# Patient Record
Sex: Male | Born: 1940 | Race: White | Hispanic: No | Marital: Married | Smoking: Former smoker
Health system: Southern US, Community
[De-identification: ages and names within clinical notes are randomized; demographics above are authoritative.]

## PROBLEM LIST (undated history)

## (undated) DIAGNOSIS — M25511 Pain in right shoulder: Secondary | ICD-10-CM

## (undated) DIAGNOSIS — F329 Major depressive disorder, single episode, unspecified: Secondary | ICD-10-CM

## (undated) DIAGNOSIS — S86319A Strain of muscle(s) and tendon(s) of peroneal muscle group at lower leg level, unspecified leg, initial encounter: Secondary | ICD-10-CM

## (undated) DIAGNOSIS — C4492 Squamous cell carcinoma of skin, unspecified: Secondary | ICD-10-CM

## (undated) DIAGNOSIS — N529 Male erectile dysfunction, unspecified: Secondary | ICD-10-CM

## (undated) DIAGNOSIS — M159 Polyosteoarthritis, unspecified: Secondary | ICD-10-CM

## (undated) DIAGNOSIS — M1990 Unspecified osteoarthritis, unspecified site: Secondary | ICD-10-CM

## (undated) DIAGNOSIS — M7660 Achilles tendinitis, unspecified leg: Secondary | ICD-10-CM

## (undated) DIAGNOSIS — R200 Anesthesia of skin: Secondary | ICD-10-CM

## (undated) DIAGNOSIS — G8929 Other chronic pain: Secondary | ICD-10-CM

## (undated) HISTORY — PX: APPENDECTOMY: SHX54

## (undated) HISTORY — DX: Achilles tendinitis, unspecified leg: M76.60

## (undated) HISTORY — DX: Pain in right shoulder: M25.511

## (undated) HISTORY — DX: Polyosteoarthritis, unspecified: M15.9

## (undated) HISTORY — DX: Major depressive disorder, single episode, unspecified: F32.9

## (undated) HISTORY — DX: Strain of muscle(s) and tendon(s) of peroneal muscle group at lower leg level, unspecified leg, initial encounter: S86.319A

## (undated) HISTORY — DX: Anesthesia of skin: R20.0

## (undated) HISTORY — PX: TONSILLECTOMY: SUR1361

## (undated) HISTORY — DX: Other chronic pain: G89.29

## (undated) HISTORY — DX: Male erectile dysfunction, unspecified: N52.9

---
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
## 1995-01-09 DIAGNOSIS — F32A Depression, unspecified: Secondary | ICD-10-CM

## 1995-01-09 HISTORY — DX: Depression, unspecified: F32.A

## 2004-09-13 ENCOUNTER — Ambulatory Visit: Payer: Self-pay | Admitting: Internal Medicine

## 2005-05-22 ENCOUNTER — Ambulatory Visit: Payer: Self-pay | Admitting: Internal Medicine

## 2006-09-06 ENCOUNTER — Ambulatory Visit: Payer: Self-pay | Admitting: Internal Medicine

## 2006-09-06 LAB — CONVERTED CEMR LAB
ALT: 25 units/L (ref 0–53)
AST: 30 units/L (ref 0–37)
Albumin: 3.7 g/dL (ref 3.5–5.2)
Alkaline Phosphatase: 35 units/L — ABNORMAL LOW (ref 39–117)
BUN: 26 mg/dL — ABNORMAL HIGH (ref 6–23)
Basophils Absolute: 0.1 10*3/uL (ref 0.0–0.1)
Basophils Relative: 1 % (ref 0.0–1.0)
Bilirubin Urine: NEGATIVE
Bilirubin, Direct: 0.3 mg/dL (ref 0.0–0.3)
Blood in Urine, dipstick: NEGATIVE
CO2: 25 meq/L (ref 19–32)
Calcium: 9.4 mg/dL (ref 8.4–10.5)
Chloride: 110 meq/L (ref 96–112)
Cholesterol: 138 mg/dL (ref 0–200)
Creatinine, Ser: 1 mg/dL (ref 0.4–1.5)
Eosinophils Absolute: 0.3 10*3/uL (ref 0.0–0.6)
Eosinophils Relative: 4.3 % (ref 0.0–5.0)
GFR calc Af Amer: 96 mL/min
GFR calc non Af Amer: 79 mL/min
Glucose, Bld: 112 mg/dL — ABNORMAL HIGH (ref 70–99)
Glucose, Urine, Semiquant: NEGATIVE
HCT: 43.6 % (ref 39.0–52.0)
HDL: 40.3 mg/dL (ref >39.0)
Hemoglobin: 15.2 g/dL (ref 13.0–17.0)
Ketones, urine, test strip: NEGATIVE
LDL Cholesterol: 92 mg/dL (ref 0–99)
Lymphocytes Relative: 40.7 % (ref 12.0–46.0)
MCHC: 34.8 g/dL (ref 30.0–36.0)
MCV: 97.5 fL (ref 78.0–100.0)
Monocytes Absolute: 0.9 10*3/uL — ABNORMAL HIGH (ref 0.2–0.7)
Monocytes Relative: 14 % — ABNORMAL HIGH (ref 3.0–11.0)
Neutro Abs: 2.6 10*3/uL (ref 1.4–7.7)
Neutrophils Relative %: 40 % — ABNORMAL LOW (ref 43.0–77.0)
Nitrite: NEGATIVE
PSA: 0.33 ng/mL (ref 0.10–4.00)
Platelets: 285 10*3/uL (ref 150–400)
Potassium: 4.9 meq/L (ref 3.5–5.1)
Protein, U semiquant: NEGATIVE
RBC: 4.47 M/uL (ref 4.22–5.81)
RDW: 13.3 % (ref 11.5–14.6)
Sodium: 142 meq/L (ref 135–145)
Specific Gravity, Urine: 1.02
TSH: 0.76 microintl units/mL (ref 0.35–5.50)
Total Bilirubin: 1.2 mg/dL (ref 0.3–1.2)
Total CHOL/HDL Ratio: 3.4
Total Protein: 6.7 g/dL (ref 6.0–8.3)
Triglycerides: 30 mg/dL (ref 0–149)
Urobilinogen, UA: 0.2
VLDL: 6 mg/dL (ref 0–40)
WBC Urine, dipstick: NEGATIVE
WBC: 6.4 10*3/uL (ref 4.5–10.5)
pH: 6.5

## 2006-09-17 ENCOUNTER — Ambulatory Visit: Payer: Self-pay | Admitting: Internal Medicine

## 2008-02-06 ENCOUNTER — Ambulatory Visit: Payer: Self-pay | Admitting: Internal Medicine

## 2008-02-06 DIAGNOSIS — F528 Other sexual dysfunction not due to a substance or known physiological condition: Secondary | ICD-10-CM

## 2008-02-06 LAB — CONVERTED CEMR LAB: Testosterone-% Free: 1.5 % — ABNORMAL LOW (ref 1.6–2.9)

## 2008-02-10 ENCOUNTER — Telehealth: Payer: Self-pay | Admitting: Internal Medicine

## 2008-03-30 ENCOUNTER — Ambulatory Visit: Payer: Self-pay | Admitting: Internal Medicine

## 2008-03-31 LAB — CONVERTED CEMR LAB
ALT: 26 units/L (ref 0–53)
Albumin: 3.5 g/dL (ref 3.5–5.2)
Basophils Relative: 0.3 % (ref 0.0–3.0)
CO2: 27 meq/L (ref 19–32)
Chloride: 110 meq/L (ref 96–112)
Cholesterol: 129 mg/dL (ref 0–200)
Creatinine, Ser: 1 mg/dL (ref 0.4–1.5)
Eosinophils Absolute: 0.2 10*3/uL (ref 0.0–0.7)
Eosinophils Relative: 3.2 % (ref 0.0–5.0)
HCT: 43.2 % (ref 39.0–52.0)
Hemoglobin: 15.1 g/dL (ref 13.0–17.0)
LDL Cholesterol: 75 mg/dL (ref 0–99)
Lymphs Abs: 2.6 10*3/uL (ref 0.7–4.0)
MCHC: 35 g/dL (ref 30.0–36.0)
MCV: 99.6 fL (ref 78.0–100.0)
Monocytes Absolute: 1 10*3/uL (ref 0.1–1.0)
Neutro Abs: 3.1 10*3/uL (ref 1.4–7.7)
Neutrophils Relative %: 44.3 % (ref 43.0–77.0)
Potassium: 4.7 meq/L (ref 3.5–5.1)
RBC: 4.34 M/uL (ref 4.22–5.81)
TSH: 0.61 microintl units/mL (ref 0.35–5.50)
Total Protein: 6.8 g/dL (ref 6.0–8.3)
Triglycerides: 52 mg/dL (ref 0.0–149.0)
WBC: 6.9 10*3/uL (ref 4.5–10.5)

## 2009-01-08 HISTORY — PX: COLONOSCOPY: SHX174

## 2009-01-08 LAB — HM COLONOSCOPY: HM COLON: NORMAL

## 2009-03-07 ENCOUNTER — Ambulatory Visit: Payer: Self-pay | Admitting: Family Medicine

## 2009-07-25 ENCOUNTER — Ambulatory Visit: Payer: Self-pay | Admitting: Family Medicine

## 2009-08-05 ENCOUNTER — Ambulatory Visit: Payer: Self-pay | Admitting: Internal Medicine

## 2009-08-05 DIAGNOSIS — E669 Obesity, unspecified: Secondary | ICD-10-CM | POA: Insufficient documentation

## 2009-08-09 LAB — CONVERTED CEMR LAB
Alkaline Phosphatase: 29 units/L — ABNORMAL LOW (ref 39–117)
BUN: 26 mg/dL — ABNORMAL HIGH (ref 6–23)
Bilirubin, Direct: 0.1 mg/dL (ref 0.0–0.3)
Calcium: 8.5 mg/dL (ref 8.4–10.5)
Chloride: 106 meq/L (ref 96–112)
Cholesterol: 143 mg/dL (ref 0–200)
Creatinine, Ser: 0.9 mg/dL (ref 0.4–1.5)
Eosinophils Absolute: 0.2 10*3/uL (ref 0.0–0.7)
HDL: 44.4 mg/dL (ref >39.00)
LDL Cholesterol: 86 mg/dL (ref 0–99)
Lymphocytes Relative: 40 % (ref 12.0–46.0)
MCHC: 33.8 g/dL (ref 30.0–36.0)
MCV: 101.5 fL — ABNORMAL HIGH (ref 78.0–100.0)
Monocytes Absolute: 0.9 10*3/uL (ref 0.1–1.0)
Neutrophils Relative %: 40.8 % — ABNORMAL LOW (ref 43.0–77.0)
PSA: 0.39 ng/mL (ref 0.10–4.00)
Platelets: 265 10*3/uL (ref 150.0–400.0)
RBC: 4.17 M/uL — ABNORMAL LOW (ref 4.22–5.81)
Total Bilirubin: 1 mg/dL (ref 0.3–1.2)
Total CHOL/HDL Ratio: 3
Triglycerides: 61 mg/dL (ref 0.0–149.0)
WBC: 6.4 10*3/uL (ref 4.5–10.5)

## 2010-02-07 NOTE — Assessment & Plan Note (Signed)
Summary: rash/dm   Vital Signs:  Patient profile:   70 year old male Temp:     98.0 degrees F oral BP sitting:   102 / 64  (left arm) Cuff size:   regular  Vitals Entered By: Sid Falcon LPN (July 25, 2009 4:10 PM)  History of Present Illness: Patient seen with slightly pruritic rash left lower leg and low back region for approximately 3 months. Slightly pruritic. Sometimes burns topically with sweat. Has tried over-the-counter moisturizing cream without any improvement. No other exacerbating factors.  Allergies (verified): No Known Drug Allergies  Past History:  Past Medical History: Last updated: 08/20/2006 Depression, major  1997 PMH reviewed for relevance  Review of Systems  The patient denies anorexia, fever, and weight loss.    Physical Exam  General:  Well-developed,well-nourished,in no acute distress; alert,appropriate and cooperative throughout examination Skin:  patient has rash left leg and low back region with 2 separate patches lower back and 2 small patches left leg all these are circular to oval in shape macular slightly erythematous with slightly scaly surface. No elevated border. No pustules.   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 Taper as follows:  6-6-4-4-3-3-2-1 His updated medication list for this problem includes:    Triamcinolone Acetonide 0.5 % Crea (Triamcinolone acetonide) .Marland Kitchen... Apply to affected rash two times a day as needed  Complete Medication List: 1)  Aspir-81 81 Mg Tbec (Aspirin) .... Take 1 tablet by mouth once a day 2)  Viagra 100 Mg Tabs (Sildenafil citrate) .... 1/2-1 by mouth 30 minutes to 4 hours prior to sexual activity 3)  Triamcinolone Acetonide 0.5 % Crea (Triamcinolone acetonide) .... Apply to affected rash two times a day as needed  Patient  Instructions: 1)  continue topical use of moisturizer as needed for rash. Prescriptions: TRIAMCINOLONE ACETONIDE 0.5 % CREA (TRIAMCINOLONE ACETONIDE) apply to affected rash two times a day as needed  #30 gm x 2   Entered and Authorized by:   Evelena Peat MD   Signed by:   Evelena Peat MD on 07/25/2009   Method used:   Electronically to        [facility] [phone]* (retail)       [address] [place], [place]  [address]       Ph: [phone]       Fax: [phone]   RxID:   [id number]

## 2010-02-07 NOTE — Assessment & Plan Note (Signed)
Summary: NECK AND ARM PAIN X THURSDAY//CCM   Vital Signs:  Patient profile:   70 year old male Weight:      228 pounds BMI:     33.55 Temp:     98.8 degrees F oral BP sitting:   130 / 80  (left arm) Cuff size:   regular  Vitals Entered By: Sid Falcon LPN (March 07, 2009 9:31 AM)  Nutrition Counseling: Patient's BMI is greater than 25 and therefore counseled on weight management options. CC: Rt neck/arm pain 4 days   History of Present Illness: Acute visit. Patient seen with right neck and right upper extremity pain which started last Thursday after falling asleep in a chair with his head laterally bent to the right. He describes constant achy pain right neck and down right upper extremity since then. Symptoms somewhat better in the morning and worse at night. Difficulty sleeping secondary to pain. Has tried Naprosyn and heat with minimal improvement. No numbness or weakness. Pain radiates halfway down his right arm. Symptoms worse with straightening the arm. No prior history of neck surgery.  Allergies (verified): No Known Drug Allergies  Past History:  Past Medical History: Last updated: 08/20/2006 Depression, major  1997  Past Surgical History: Last updated: 08/20/2006 Appendectomy, age 55 PMH reviewed for relevance  Review of Systems      See HPI  Physical Exam  General:  Well-developed,well-nourished,in no acute distress; alert,appropriate and cooperative throughout examination Neck:  no masses palpated. No spinal tenderness. Patient has fairly good range of motion and no pain with flexion or extension. No pain with lateral bending. Lungs:  Normal respiratory effort, chest expands symmetrically. Lungs are clear to auscultation, no crackles or wheezes. Heart:  normal rate and regular rhythm.   Neurologic:  full strength upper extremities. 2+ symmetric reflexes. Normal sensory function.   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  Nonfocal neuro exam.  Trial of prednisone and pain meds to use as needed and f/u with primary if no better in 2 weeks. His updated medication list for this problem includes:    Aspir-81 81 Mg Tbec (Aspirin) .Marland Kitchen... Take 1 tablet by mouth once a day    Hydrocodone-acetaminophen 5-325 Mg Tabs (Hydrocodone-acetaminophen) .Marland Kitchen... 1-2 by mouth q 4-6 hours as needed pain  Complete Medication List: 1)  Aspir-81 81 Mg Tbec (Aspirin) .... Take 1 tablet by mouth once a day 2)  Viagra 100 Mg Tabs (Sildenafil citrate) .... 1/2-1 by mouth 30 minutes to 4 hours prior to sexual activity 3)  Hydrocodone-acetaminophen 5-325 Mg Tabs (Hydrocodone-acetaminophen) .Marland Kitchen.. 1-2 by mouth q 4-6 hours as needed pain 4)  Prednisone 10 Mg Tabs (Prednisone) .... Taper as follows:  6-6-4-4-3-3-2-1  Patient Instructions: 1)  Followup with your primary physician in 2 weeks if symptoms not resolving and sooner if you notice any weakness or progressive pain before then. Prescriptions: PREDNISONE 10 MG TABS (PREDNISONE) taper as follows:  6-6-4-4-3-3-2-1  #29 x 0   Entered and Authorized by:   Evelena Peat MD   Signed by:   Evelena Peat MD on 03/07/2009   Method used:   Print then Give to Patient   RxID:   [id number] HYDROCODONE-ACETAMINOPHEN 5-325 MG TABS (HYDROCODONE-ACETAMINOPHEN) 1-2 by mouth q 4-6 hours as needed pain  #30 x 0   Entered and Authorized by:   Evelena Peat MD   Signed by:   Evelena Peat MD on 03/07/2009   Method used:  Print then Give to Patient   RxID:   [id number]

## 2010-02-07 NOTE — Assessment & Plan Note (Signed)
Summary: emp/pt coming in fasting/cjr   Vital Signs:  Patient profile:   70 year old male Height:      68.5 inches Weight:      212 pounds BMI:     31.88 Temp:     98.4 degrees F oral Pulse rate:   64 / minute Pulse rhythm:   regular BP sitting:   120 / 80  (left arm) Cuff size:   regular  Vitals Entered By: Kern Reap CMA Duncan Dull) (August 05, 2009 8:18 AM) CC: yearly physical Is Patient Diabetic? No Pain Assessment Patient in pain? no        CC:  yearly physical.  History of Present Illness: CPX  Preventive Screening-Counseling & Management  Alcohol-Tobacco     Smoking Status: quit     Year Quit: 1972  Current Problems (verified): 1)  Erectile Dysfunction  (ICD-302.72) 2)  Physical Examination  (ICD-V70.0)  Current Medications (verified): 1)  Aspir-81 81 Mg Tbec (Aspirin) .... Take 1 Tablet By Mouth Once A Day 2)  Viagra 100 Mg Tabs (Sildenafil Citrate) .... 1/2-1 By Mouth 30 Minutes To 4 Hours Prior To Sexual Activity 3)  Triamcinolone Acetonide 0.5 % Crea (Triamcinolone Acetonide) .... Apply To Affected Rash Two Times A Day As Needed  Allergies (verified): No Known Drug Allergies  Past History:  Past Medical History: Last updated: 08/20/2006 Depression, major  1997  Social History: Last updated: 03/30/2008 married non smoker  Risk Factors: Smoking Status: quit (08/05/2009)  Past Surgical History: Appendectomy, age 32 Tonsillectomy  Family History: mother age 38---acute CHF father age 68 deceased (unknown cause)  Physical Exam  General:  Well-developed,well-nourished,in no acute distress; alert,appropriate and cooperative throughout examination overweight Head:  normocephalic and atraumatic.   Eyes:  pupils equal and pupils round.   Ears:  R ear normal and L ear normal.   Neck:  No deformities, masses, or tenderness noted. Chest Wall:  No deformities, masses, tenderness or gynecomastia noted. Lungs:  normal respiratory effort and no  intercostal retractions.   Heart:  normal rate and regular rhythm.   Abdomen:  soft and non-tender.  overweight Msk:  No deformity or scoliosis noted of thoracic or lumbar spine.   Neurologic:  cranial nerves II-XII intact and gait normal.   Skin:  turgor normal and color normal.   Psych:  normally interactive and good eye contact.     Impression & Recommendations:  Problem # 1:  PHYSICAL EXAMINATION (ICD-V70.0)  health maint utd--we will get confirmation of last colonoscopy encouraged continued good health habits and continued weight loss check labs today  Orders: Urinalysis-dipstick only (Medicare patient) (16109UE) Venipuncture (45409) TLB-Lipid Panel (80061-LIPID) TLB-BMP (Basic Metabolic Panel-BMET) (80048-METABOL) TLB-CBC Platelet - w/Differential (85025-CBCD) TLB-Hepatic/Liver Function Pnl (80076-HEPATIC) TLB-TSH (Thyroid Stimulating Hormone) (84443-TSH) TLB-PSA (Prostate Specific Antigen) (84153-PSA)  Problem # 2:  OBESITY (ICD-278.00) advised continued weight loss  Complete Medication List: 1)  Aspir-81 81 Mg Tbec (Aspirin) .... Take 1 tablet by mouth once a day 2)  Viagra 100 Mg Tabs (Sildenafil citrate) .... 1/2-1 by mouth 30 minutes to 4 hours prior to sexual activity 3)  Triamcinolone Acetonide 0.5 % Crea (Triamcinolone acetonide) .... Apply to affected rash two times a day as needed     Appended Document: Orders Update    Clinical Lists Changes  Orders: Added new Service order of Specimen Handling ([address]) - Signed Observations: Added new observation of COMMENTS: Wynona Canes, CMA  August 05, 2009 10:37 AM  (08/05/2009 8:58) Added new observation of  PH URINE: 6.0  (08/05/2009 8:58) Added new observation of SPEC GR URIN: 1.015  (08/05/2009 8:58) Added new observation of APPEARANCE U: Clear  (08/05/2009 8:58) Added new observation of UA COLOR: yellow  (08/05/2009 8:58) Added new observation of WBC DIPSTK U: negative  (08/05/2009 8:58) Added new  observation of NITRITE URN: negative  (08/05/2009 8:58) Added new observation of UROBILINOGEN: 0.2  (08/05/2009 8:58) Added new observation of PROTEIN, URN: negative  (08/05/2009 8:58) Added new observation of BLOOD UR DIP: negative  (08/05/2009 8:58) Added new observation of KETONES URN: negative  (08/05/2009 8:58) Added new observation of BILIRUBIN UR: negative  (08/05/2009 8:58) Added new observation of GLUCOSE, URN: negative  (08/05/2009 8:58)      Laboratory Results   Urine Tests  Date/Time Recieved: August 05, 2009 10:37 AM  Date/Time Reported: August 05, 2009 10:37 AM   Routine Urinalysis   Color: yellow Appearance: Clear Glucose: negative   (Normal Range: Negative) Bilirubin: negative   (Normal Range: Negative) Ketone: negative   (Normal Range: Negative) Spec. Gravity: 1.015   (Normal Range: 1.003-1.035) Blood: negative   (Normal Range: Negative) pH: 6.0   (Normal Range: 5.0-8.0) Protein: negative   (Normal Range: Negative) Urobilinogen: 0.2   (Normal Range: 0-1) Nitrite: negative   (Normal Range: Negative) Leukocyte Esterace: negative   (Normal Range: Negative)    Comments: Wynona Canes, CMA  August 05, 2009 10:37 AM

## 2010-08-01 ENCOUNTER — Encounter: Payer: Self-pay | Admitting: Internal Medicine

## 2010-08-09 ENCOUNTER — Ambulatory Visit (INDEPENDENT_AMBULATORY_CARE_PROVIDER_SITE_OTHER): Payer: Medicare Other | Admitting: Internal Medicine

## 2010-08-09 ENCOUNTER — Encounter: Payer: Self-pay | Admitting: Internal Medicine

## 2010-08-09 VITALS — BP 124/84 | HR 68 | Temp 99.0°F | Ht 69.75 in | Wt 208.0 lb

## 2010-08-09 DIAGNOSIS — Z2911 Encounter for prophylactic immunotherapy for respiratory syncytial virus (RSV): Secondary | ICD-10-CM

## 2010-08-09 DIAGNOSIS — Z79899 Other long term (current) drug therapy: Secondary | ICD-10-CM

## 2010-08-09 DIAGNOSIS — Z13220 Encounter for screening for lipoid disorders: Secondary | ICD-10-CM

## 2010-08-09 DIAGNOSIS — Z125 Encounter for screening for malignant neoplasm of prostate: Secondary | ICD-10-CM

## 2010-08-09 DIAGNOSIS — Z0000 Encounter for general adult medical examination without abnormal findings: Secondary | ICD-10-CM

## 2010-08-09 LAB — POCT URINALYSIS DIPSTICK
Bilirubin, UA: NEGATIVE
Glucose, UA: NEGATIVE
Leukocytes, UA: NEGATIVE
Nitrite, UA: NEGATIVE
Urobilinogen, UA: 0.2

## 2010-08-09 LAB — CBC WITH DIFFERENTIAL/PLATELET
Basophils Absolute: 0 10*3/uL (ref 0.0–0.1)
Eosinophils Absolute: 0.3 10*3/uL (ref 0.0–0.7)
Hemoglobin: 14.6 g/dL (ref 13.0–17.0)
Lymphocytes Relative: 40.6 % (ref 12.0–46.0)
Lymphs Abs: 2.3 10*3/uL (ref 0.7–4.0)
MCHC: 33.4 g/dL (ref 30.0–36.0)
MCV: 100.7 fl — ABNORMAL HIGH (ref 78.0–100.0)
Monocytes Absolute: 0.8 10*3/uL (ref 0.1–1.0)
Neutro Abs: 2.3 10*3/uL (ref 1.4–7.7)
RBC: 4.34 Mil/uL (ref 4.22–5.81)
RDW: 13.6 % (ref 11.5–14.6)
WBC: 5.6 10*3/uL (ref 4.5–10.5)

## 2010-08-09 LAB — BASIC METABOLIC PANEL
BUN: 23 mg/dL (ref 6–23)
Creatinine, Ser: 1 mg/dL (ref 0.4–1.5)
GFR: 79.39 mL/min (ref >60.00)
Potassium: 5 mEq/L (ref 3.5–5.1)

## 2010-08-09 LAB — PSA: PSA: 0.43 ng/mL (ref 0.10–4.00)

## 2010-08-09 LAB — LIPID PANEL
HDL: 53.7 mg/dL (ref >39.00)
Total CHOL/HDL Ratio: 3

## 2010-08-09 NOTE — Progress Notes (Signed)
  Subjective:    Patient ID: Paul Knight, male    DOB: [birth year], 70 y.o.   MRN: [id number]  HPI  Well visit  Occasionally notes an unusual sensation of right hand---only in a.m. Describes a burning sensation when he rubs the back of hand from proximal to distal  Past Medical History  Diagnosis Date  . Depression 1997    major   Past Surgical History  Procedure Date  . Appendectomy   . Tonsillectomy     reports that he has never smoked. He does not have any smokeless tobacco history on file. His alcohol and drug histories not on file. family history includes Heart attack in his mother; Heart disease in his brother; and Heart failure in his mother. No Known Allergies   Review of Systems  patient denies chest pain, shortness of breath, orthopnea. Denies lower extremity edema, abdominal pain, change in appetite, change in bowel movements. Patient denies rashes, musculoskeletal complaints. No other specific complaints in a complete review of systems.      Objective:   Physical Exam Well-developed male in no acute distress. HEENT exam atraumatic, normocephalic, extraocular muscles are intact. Conjunctivae are pink without exudate. Neck is supple without lymphadenopathy, thyromegaly, jugular venous distention. Chest is clear to auscultation without increased work of breathing. Cardiac exam S1-S2 are regular. The PMI is normal. No significant murmurs or gallops. Abdominal exam active bowel sounds, soft, nontender. No abdominal bruits. Extremities no clubbing cyanosis or edema. Peripheral pulses are normal without bruits. Neurologic exam alert and oriented without any motor or sensory deficits. Rectal exam normal tone prostate normal size without masses or asymmetry.     Assessment & Plan:  Well visit- health maint UTD.

## 2010-08-10 LAB — HEPATIC FUNCTION PANEL
AST: 29 U/L (ref 0–37)
Alkaline Phosphatase: 30 U/L — ABNORMAL LOW (ref 39–117)
Bilirubin, Direct: 0.2 mg/dL (ref 0.0–0.3)
Total Bilirubin: 1.1 mg/dL (ref 0.3–1.2)

## 2011-01-03 ENCOUNTER — Telehealth: Payer: Self-pay

## 2011-01-03 NOTE — Telephone Encounter (Signed)
Pt's son is home for the holidays and was diagnosed with Type A Influenza. Pt's son was advised that his parents needed to start on Tamiflu asap. Pls advise.

## 2011-01-04 MED ORDER — OSELTAMIVIR PHOSPHATE 75 MG PO CAPS: 75.0000 mg | ORAL_CAPSULE | Freq: Every day | ORAL | Status: AC

## 2011-01-04 NOTE — Telephone Encounter (Signed)
tamiflu 75 mg po qd for 10 days. 

## 2011-01-04 NOTE — Telephone Encounter (Signed)
Rx sent to [facility] electronically. Pt aware.

## 2011-12-17 ENCOUNTER — Encounter: Payer: Self-pay | Admitting: Internal Medicine

## 2011-12-17 ENCOUNTER — Ambulatory Visit (INDEPENDENT_AMBULATORY_CARE_PROVIDER_SITE_OTHER): Payer: Medicare Other | Admitting: Internal Medicine

## 2011-12-17 VITALS — BP 120/82 | HR 72 | Temp 97.9°F | Ht 70.0 in | Wt 198.0 lb

## 2011-12-17 DIAGNOSIS — Z0000 Encounter for general adult medical examination without abnormal findings: Secondary | ICD-10-CM

## 2011-12-17 DIAGNOSIS — E669 Obesity, unspecified: Secondary | ICD-10-CM

## 2011-12-17 LAB — HEPATIC FUNCTION PANEL
ALT: 27 U/L (ref 0–53)
AST: 26 U/L (ref 0–37)
Total Protein: 6.9 g/dL (ref 6.0–8.3)

## 2011-12-17 LAB — BASIC METABOLIC PANEL
BUN: 21 mg/dL (ref 6–23)
CO2: 26 mEq/L (ref 19–32)
Chloride: 103 mEq/L (ref 96–112)
Creatinine, Ser: 1.1 mg/dL (ref 0.4–1.5)
Glucose, Bld: 95 mg/dL (ref 70–99)
Potassium: 4.5 mEq/L (ref 3.5–5.1)

## 2011-12-17 LAB — CBC WITH DIFFERENTIAL/PLATELET
Eosinophils Relative: 2.6 % (ref 0.0–5.0)
Monocytes Relative: 12.1 % — ABNORMAL HIGH (ref 3.0–12.0)
Neutrophils Relative %: 47.2 % (ref 43.0–77.0)
Platelets: 309 10*3/uL (ref 150.0–400.0)
WBC: 6.7 10*3/uL (ref 4.5–10.5)

## 2011-12-17 LAB — LIPID PANEL
Cholesterol: 128 mg/dL (ref 0–200)
HDL: 48.9 mg/dL (ref >39.00)
LDL Cholesterol: 71 mg/dL (ref 0–99)
Triglycerides: 43 mg/dL (ref 0.0–149.0)
VLDL: 8.6 mg/dL (ref 0.0–40.0)

## 2011-12-17 LAB — TSH: TSH: 0.68 u[IU]/mL (ref 0.35–5.50)

## 2011-12-17 MED ORDER — SILDENAFIL CITRATE 100 MG PO TABS: 100.0000 mg | ORAL_TABLET | Freq: Every day | ORAL | Status: DC | PRN

## 2011-12-17 NOTE — Progress Notes (Signed)
Patient ID: Paul Knight, male   DOB: [birth year], 71 y.o.   MRN: [id number] cpx  Past Medical History  Diagnosis Date  . Depression 1997    major    History   Social History  . Marital Status: Married    Spouse Name: N/A    Number of Children: N/A  . Years of Education: N/A   Occupational History  . Not on file.   Social History Main Topics  . Smoking status: Never Smoker   . Smokeless tobacco: Not on file  . Alcohol Use:   . Drug Use:   . Sexually Active:    Other Topics Concern  . Not on file   Social History Narrative  . No narrative on file    Past Surgical History  Procedure Date  . Appendectomy   . Tonsillectomy     Family History  Problem Relation Age of Onset  . Heart failure Mother   . Heart attack Mother   . Heart disease Brother     No Known Allergies  Current Outpatient Prescriptions on File Prior to Visit  Medication Sig Dispense Refill  . aspirin 81 MG tablet Take 81 mg by mouth daily.        . sildenafil (VIAGRA) 100 MG tablet Take 100 mg by mouth daily as needed.           patient denies chest pain, shortness of breath, orthopnea. Denies lower extremity edema, abdominal pain, change in appetite, change in bowel movements. Patient denies rashes, musculoskeletal complaints. No other specific complaints in a complete review of systems.   BP 120/82  Pulse 72  Temp 97.9 F (36.6 C) (Oral)  Ht 5\' 10"  (1.778 m)  Wt 198 lb (89.812 kg)  BMI 28.41 kg/m2  well-developed well-nourished male in no acute distress. HEENT exam atraumatic, normocephalic, neck supple without jugular venous distention. Chest clear to auscultation cardiac exam S1-S2 are regular. Abdominal exam overweight with bowel sounds, soft and nontender. Extremities no edema. Neurologic exam is alert with a normal gait.  A/p- well visit- health maint UTD

## 2011-12-19 ENCOUNTER — Encounter: Payer: Self-pay | Admitting: *Deleted

## 2012-06-03 ENCOUNTER — Telehealth: Payer: Self-pay | Admitting: Internal Medicine

## 2012-06-03 ENCOUNTER — Encounter: Payer: Self-pay | Admitting: Internal Medicine

## 2012-06-03 ENCOUNTER — Ambulatory Visit (INDEPENDENT_AMBULATORY_CARE_PROVIDER_SITE_OTHER): Payer: Medicare Other | Admitting: Internal Medicine

## 2012-06-03 VITALS — BP 112/72 | Temp 98.2°F | Wt 196.0 lb

## 2012-06-03 DIAGNOSIS — W57XXXA Bitten or stung by nonvenomous insect and other nonvenomous arthropods, initial encounter: Secondary | ICD-10-CM

## 2012-06-03 DIAGNOSIS — A6920 Lyme disease, unspecified: Secondary | ICD-10-CM

## 2012-06-03 MED ORDER — DOXYCYCLINE HYCLATE 100 MG PO TABS: 100.0000 mg | ORAL_TABLET | Freq: Two times a day (BID) | ORAL | Status: DC

## 2012-06-03 NOTE — Assessment & Plan Note (Signed)
72 year old male reports multiple tick bites. He has fair size erythematous area left lower abdomen. It is consistent with bulls eye pattern. Treat for presumed Lyme disease with doxycycline 100 mg twice daily for 21 days. Patient advised to followup with his primary care physician within 2 months.

## 2012-06-03 NOTE — Progress Notes (Signed)
  Subjective:    Patient ID: Paul Knight, male    DOB: 08/14/1940, 72 y.o.   MRN: [id number]  HPI  72 year old white male complains of multiple tick bites over last one month. He reports clearing "under brush" near his house. Patient noticed several ticks attached to his right lower leg. He also removed tick from his left lower abdomen. He is pretty sure several ticks were attached greater than 24 hours.  Patient also has a dog with multiple tick bites. He was evaluated by a Vet and was diagnosed with Lyme's disease. The dog was acting lethargic.  Patient denies any symptoms of fever, unusual fatigue or joint pains. He's had small red spots on tick bites in his right leg but larger area of redness and left lower abdomen.  Review of Systems     Past Medical History  Diagnosis Date  . Depression 1997    major    History   Social History  . Marital Status: Married    Spouse Name: N/A    Number of Children: N/A  . Years of Education: N/A   Occupational History  . Not on file.   Social History Main Topics  . Smoking status: Never Smoker   . Smokeless tobacco: Not on file  . Alcohol Use:   . Drug Use:   . Sexually Active:    Other Topics Concern  . Not on file   Social History Narrative  . No narrative on file    Past Surgical History  Procedure Laterality Date  . Appendectomy    . Tonsillectomy      Family History  Problem Relation Age of Onset  . Heart failure Mother   . Heart attack Mother   . Heart disease Brother     No Known Allergies  Current Outpatient Prescriptions on File Prior to Visit  Medication Sig Dispense Refill  . aspirin 81 MG tablet Take 81 mg by mouth daily.        . sildenafil (VIAGRA) 100 MG tablet Take 1 tablet (100 mg total) by mouth daily as needed.  10 tablet  3   No current facility-administered medications on file prior to visit.    BP 112/72  Temp(Src) 98.2 F (36.8 C) (Oral)  Wt 196 lb (88.905 kg)  BMI 28.12  kg/m2    Objective:   Physical Exam  Constitutional: He appears well-developed and well-nourished.  Cardiovascular: Normal rate and regular rhythm.   Pulmonary/Chest: Effort normal and breath sounds normal. He has no wheezes.  Skin:  3 small (sub centimeter) red area on right lower leg.   2 x 4 cm oblong erythematous rash of left lower abdomen (bulls eye pattern)          Assessment & Plan:

## 2012-06-03 NOTE — Telephone Encounter (Signed)
Dr Cato Mulligan is out of the office this week.  Appt was scheduled with Dr Artist Pais at 2:45 today

## 2012-06-03 NOTE — Patient Instructions (Addendum)
Please follow up with Dr. Cato Mulligan within 2 months

## 2012-06-03 NOTE — Telephone Encounter (Signed)
Patients wife called and stated that the PT has been bit by a tick 5 times within the last 3 weeks. Her dog was taken to the vet 3 weeks ago, and she was found to have limes disease. The vet described to the PT's that if anyone was bitten by a deer tick, they should be checked for limes disease. The pt's wife attempted to speak with triage multiple times this morning, but was disconnected each time. Please assist.

## 2012-11-13 ENCOUNTER — Other Ambulatory Visit: Payer: Self-pay

## 2013-02-24 ENCOUNTER — Other Ambulatory Visit: Payer: Medicare Other

## 2013-02-27 ENCOUNTER — Other Ambulatory Visit (INDEPENDENT_AMBULATORY_CARE_PROVIDER_SITE_OTHER): Payer: Commercial Managed Care - HMO

## 2013-02-27 DIAGNOSIS — Z0000 Encounter for general adult medical examination without abnormal findings: Secondary | ICD-10-CM

## 2013-02-27 LAB — CBC WITH DIFFERENTIAL/PLATELET
BASOS ABS: 0 10*3/uL (ref 0.0–0.1)
BASOS PCT: 0.8 % (ref 0.0–3.0)
EOS ABS: 0.2 10*3/uL (ref 0.0–0.7)
Eosinophils Relative: 4 % (ref 0.0–5.0)
HEMATOCRIT: 44.5 % (ref 39.0–52.0)
HEMOGLOBIN: 14.6 g/dL (ref 13.0–17.0)
LYMPHS ABS: 2.2 10*3/uL (ref 0.7–4.0)
LYMPHS PCT: 43.5 % (ref 12.0–46.0)
MCHC: 32.8 g/dL (ref 30.0–36.0)
MCV: 102.4 fl — ABNORMAL HIGH (ref 78.0–100.0)
Monocytes Absolute: 0.7 10*3/uL (ref 0.1–1.0)
Monocytes Relative: 14.6 % — ABNORMAL HIGH (ref 3.0–12.0)
NEUTROS ABS: 1.9 10*3/uL (ref 1.4–7.7)
Neutrophils Relative %: 37.1 % — ABNORMAL LOW (ref 43.0–77.0)
Platelets: 293 10*3/uL (ref 150.0–400.0)
RBC: 4.34 Mil/uL (ref 4.22–5.81)
RDW: 13.9 % (ref 11.5–14.6)
WBC: 5.1 10*3/uL (ref 4.5–10.5)

## 2013-02-27 LAB — HEPATIC FUNCTION PANEL
ALBUMIN: 3.6 g/dL (ref 3.5–5.2)
ALK PHOS: 36 U/L — AB (ref 39–117)
ALT: 24 U/L (ref 0–53)
AST: 29 U/L (ref 0–37)
Bilirubin, Direct: 0.2 mg/dL (ref 0.0–0.3)
TOTAL PROTEIN: 6.7 g/dL (ref 6.0–8.3)
Total Bilirubin: 1.5 mg/dL — ABNORMAL HIGH (ref 0.3–1.2)

## 2013-02-27 LAB — POCT URINALYSIS DIPSTICK
Bilirubin, UA: NEGATIVE
Blood, UA: NEGATIVE
Glucose, UA: NEGATIVE
KETONES UA: NEGATIVE
Leukocytes, UA: NEGATIVE
Nitrite, UA: NEGATIVE
PH UA: 7
PROTEIN UA: NEGATIVE
SPEC GRAV UA: 1.015
UROBILINOGEN UA: 0.2

## 2013-02-27 LAB — LIPID PANEL
Cholesterol: 135 mg/dL (ref 0–200)
HDL: 60.7 mg/dL (ref >39.00)
LDL CALC: 69 mg/dL (ref 0–99)
Total CHOL/HDL Ratio: 2
Triglycerides: 27 mg/dL (ref 0.0–149.0)
VLDL: 5.4 mg/dL (ref 0.0–40.0)

## 2013-02-27 LAB — TSH: TSH: 0.78 u[IU]/mL (ref 0.35–5.50)

## 2013-02-27 LAB — BASIC METABOLIC PANEL
BUN: 17 mg/dL (ref 6–23)
CALCIUM: 9 mg/dL (ref 8.4–10.5)
CHLORIDE: 107 meq/L (ref 96–112)
CO2: 27 meq/L (ref 19–32)
Creatinine, Ser: 1 mg/dL (ref 0.4–1.5)
GFR: 78.82 mL/min (ref >60.00)
GLUCOSE: 97 mg/dL (ref 70–99)
POTASSIUM: 4.8 meq/L (ref 3.5–5.1)
SODIUM: 139 meq/L (ref 135–145)

## 2013-02-27 LAB — PSA: PSA: 0.44 ng/mL (ref 0.10–4.00)

## 2013-03-03 ENCOUNTER — Encounter: Payer: Medicare Other | Admitting: Internal Medicine

## 2013-03-03 NOTE — Progress Notes (Signed)
This encounter was created in error - please disregard.

## 2013-05-12 ENCOUNTER — Encounter: Payer: Commercial Managed Care - HMO | Admitting: Internal Medicine

## 2013-05-14 ENCOUNTER — Encounter: Payer: Self-pay | Admitting: Internal Medicine

## 2013-05-14 ENCOUNTER — Ambulatory Visit (INDEPENDENT_AMBULATORY_CARE_PROVIDER_SITE_OTHER): Payer: Commercial Managed Care - HMO | Admitting: Internal Medicine

## 2013-05-14 VITALS — BP 116/74 | HR 68 | Temp 98.1°F | Ht 69.75 in | Wt 189.0 lb

## 2013-05-14 DIAGNOSIS — Z23 Encounter for immunization: Secondary | ICD-10-CM

## 2013-05-14 DIAGNOSIS — Z0000 Encounter for general adult medical examination without abnormal findings: Secondary | ICD-10-CM

## 2013-05-14 NOTE — Progress Notes (Signed)
cpx  Past Medical History  Diagnosis Date  . Depression 1997    major    History   Social History  . Marital Status: Married    Spouse Name: N/A    Number of Children: N/A  . Years of Education: N/A   Occupational History  . Not on file.   Social History Main Topics  . Smoking status: Never Smoker   . Smokeless tobacco: Not on file  . Alcohol Use:   . Drug Use:   . Sexual Activity:    Other Topics Concern  . Not on file   Social History Narrative  . No narrative on file    Past Surgical History  Procedure Laterality Date  . Appendectomy    . Tonsillectomy      Family History  Problem Relation Age of Onset  . Heart failure Mother   . Heart attack Mother   . Heart disease Brother     No Known Allergies  Current Outpatient Prescriptions on File Prior to Visit  Medication Sig Dispense Refill  . aspirin 81 MG tablet Take 81 mg by mouth daily.        . sildenafil (VIAGRA) 100 MG tablet Take 1 tablet (100 mg total) by mouth daily as needed.  10 tablet  3   No current facility-administered medications on file prior to visit.     patient denies chest pain, shortness of breath, orthopnea. Denies lower extremity edema, abdominal pain, change in appetite, change in bowel movements. Patient denies rashes, musculoskeletal complaints. No other specific complaints in a complete review of systems.   BP 116/74  Pulse 68  Temp(Src) 98.1 F (36.7 C) (Oral)  Ht 5' 9.75" (1.772 m)  Wt 189 lb (85.73 kg)  BMI 27.30 kg/m2  well-developed well-nourished male in no acute distress. HEENT exam atraumatic, normocephalic, neck supple without jugular venous distention. Chest clear to auscultation cardiac exam S1-S2 are regular. Abdominal exam overweight with bowel sounds, soft and nontender. Extremities no edema. Neurologic exam is alert with a normal gait.  Well visit- health maint utd

## 2013-05-14 NOTE — Progress Notes (Signed)
Pre visit review using our clinic review tool, if applicable. No additional management support is needed unless otherwise documented below in the visit note. 

## 2013-08-05 ENCOUNTER — Telehealth: Payer: Self-pay | Admitting: Internal Medicine

## 2013-08-05 NOTE — Telephone Encounter (Signed)
Pt would like to switch to dr Burnice Logan, Can I sch?

## 2013-08-10 NOTE — Telephone Encounter (Signed)
Pt wife is aware

## 2013-08-10 NOTE — Telephone Encounter (Signed)
Please schedule with a newer provider

## 2013-10-12 ENCOUNTER — Ambulatory Visit (INDEPENDENT_AMBULATORY_CARE_PROVIDER_SITE_OTHER): Payer: Commercial Managed Care - HMO | Admitting: *Deleted

## 2013-10-12 DIAGNOSIS — Z23 Encounter for immunization: Secondary | ICD-10-CM

## 2014-03-10 DIAGNOSIS — H2513 Age-related nuclear cataract, bilateral: Secondary | ICD-10-CM | POA: Diagnosis not present

## 2014-03-10 DIAGNOSIS — H16223 Keratoconjunctivitis sicca, not specified as Sjogren's, bilateral: Secondary | ICD-10-CM | POA: Diagnosis not present

## 2014-03-10 DIAGNOSIS — H16143 Punctate keratitis, bilateral: Secondary | ICD-10-CM | POA: Diagnosis not present

## 2014-05-20 ENCOUNTER — Ambulatory Visit (INDEPENDENT_AMBULATORY_CARE_PROVIDER_SITE_OTHER): Payer: Commercial Managed Care - HMO | Admitting: Family Medicine

## 2014-05-20 ENCOUNTER — Encounter: Payer: Self-pay | Admitting: Family Medicine

## 2014-05-20 VITALS — BP 126/80 | HR 66 | Temp 97.8°F | Resp 16 | Ht 68.75 in | Wt 187.0 lb

## 2014-05-20 DIAGNOSIS — Z23 Encounter for immunization: Secondary | ICD-10-CM | POA: Diagnosis not present

## 2014-05-20 DIAGNOSIS — Z0000 Encounter for general adult medical examination without abnormal findings: Secondary | ICD-10-CM | POA: Insufficient documentation

## 2014-05-20 DIAGNOSIS — R200 Anesthesia of skin: Secondary | ICD-10-CM

## 2014-05-20 DIAGNOSIS — R208 Other disturbances of skin sensation: Secondary | ICD-10-CM | POA: Diagnosis not present

## 2014-05-20 DIAGNOSIS — G5751 Tarsal tunnel syndrome, right lower limb: Secondary | ICD-10-CM

## 2014-05-20 NOTE — Progress Notes (Signed)
Pre visit review using our clinic review tool, if applicable. No additional management support is needed unless otherwise documented below in the visit note. 

## 2014-05-20 NOTE — Progress Notes (Signed)
Office Note 05/20/2014  CC:  Chief Complaint  Patient presents with  . Establish Care    from Dr. Leanne Chang  . Annual Exam    HPI:  Paul Knight is a 74 y.o. White male who is here to transfer care from Dr. Leanne Chang at [facility] (retiring from clinical practice).  Old records in EPIC/HL EMR reviewed prior to or during today's visit.  Right ankle/foot: hx of a couple severe ankle sprains in past 30 yrs, now with progressive feeling of foot numbness on plantar surface, and increased laxity of ankle, poor sense of balance as a result of the numbness, has occ fall. The numbness does impair his ability to feel gas/brake pedal while driving.  No pain.  He has seen orthopedist in remote past and dx'd with medial ankle tendon/ligament chronic strain.  No therapy done/no surgery done.  Of note, pt no longer desires prostate cancer screening.  Past Medical History  Diagnosis Date  . 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 Erectile dysfunction     Past Surgical History  Procedure Laterality Date  . Appendectomy  1970s  . Tonsillectomy  as a child  . Colonoscopy  01/08/2009    Normal; recall 10 yrs    Family History  Problem Relation Age of Onset  . Heart failure Mother   . Heart attack Mother   . Heart disease Brother     History   Social History  . Marital Status: Married    Spouse Name: N/A  . Number of Children: N/A  . Years of Education: N/A   Occupational History  . Not on file.   Social History Main Topics  . Smoking status: Never Smoker   . Smokeless tobacco: Not on file  . Alcohol Use: 0.6 - 1.2 oz/week    1-2 Glasses of wine per week  . Drug Use: Not on file  . Sexual Activity: Not on file   Other Topics Concern  . Not on file   Social History Narrative   Married, 4 children in the [place] area.  Grandchildren x 7.   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   No tobacco.  Alcohol 2 glasses wine/day.   Exercise: [employer], walking in park, stationary bike  at home: 3 days per week for 1 hour.    Outpatient Encounter Prescriptions as of 05/20/2014  Medication Sig  . aspirin 81 MG tablet Take 81 mg by mouth daily.    . sildenafil (VIAGRA) 100 MG tablet Take 1 tablet (100 mg total) by mouth daily as needed.   No facility-administered encounter medications on file as of 05/20/2014.    No Known Allergies  ROS Review of Systems  Constitutional: Negative for fever, chills, appetite change and fatigue.  HENT: Negative for congestion, dental problem, ear pain and sore throat.   Eyes: Negative for discharge, redness and visual disturbance.  Respiratory: Negative for cough, chest tightness, shortness of breath and wheezing.   Cardiovascular: Negative for chest pain, palpitations and leg swelling.  Gastrointestinal: Negative for nausea, vomiting, abdominal pain, diarrhea and blood in stool.  Genitourinary: Negative for dysuria, urgency, frequency, hematuria, flank pain and difficulty urinating.  Musculoskeletal: Negative for myalgias, back pain, joint swelling, arthralgias and neck stiffness.  Skin: Negative for pallor and rash.  Neurological: Negative for dizziness, speech difficulty, weakness and headaches.  Hematological: Negative for adenopathy. Does not bruise/bleed easily.  Psychiatric/Behavioral: Negative for confusion and sleep disturbance. The patient is not nervous/anxious.  PE; Blood pressure 126/80, pulse 66, temperature 97.8 F (36.6 C), temperature source Oral, resp. rate 16, height 5' 8.75" (1.746 m), weight 187 lb (84.823 kg), SpO2 98 %. Gen: Alert, well appearing.  Patient is oriented to person, place, time, and situation. AFFECT: pleasant, lucid thought and speech. ENT: Ears: EACs clear, normal epithelium.  TMs with good light reflex and landmarks bilaterally.  Eyes: no injection, icteris, swelling, or exudate.  EOMI, PERRLA. Nose: no drainage or turbinate edema/swelling.  No injection or focal lesion.  Mouth: lips without  lesion/swelling.  Oral mucosa pink and moist.  Dentition intact and without obvious caries or gingival swelling.  Oropharynx without erythema, exudate, or swelling.  Neck: supple/nontender.  No LAD, mass, or TM.  Carotid pulses 2+ bilaterally, without bruits. CV: RRR, no m/r/g.   LUNGS: CTA bilat, nonlabored resps, good aeration in all lung fields. ABD: soft, NT, ND, BS normal.  No hepatospenomegaly or mass.  No bruits. EXT: no clubbing, cyanosis, or edema.  Right ankle DP and PT pulses 1+, foot warm and pink.  Mild increased laxity on talar tilt testing but anterior drawer is normal.  No ankle swelling or tenderness, no deformity.  No significant pes planus.  He has diminished sensation on R plantar surface with monofilament testing today.  He can raise up on balls of feet as long as he has something to hold on to with hands. Musculoskeletal: no joint swelling, erythema, warmth, or tenderness.  ROM of all joints intact. Skin - no sores or suspicious lesions or rashes or color changes   Pertinent labs:  Lab Results  Component Value Date   TSH 0.78 02/27/2013   Lab Results  Component Value Date   WBC 5.1 02/27/2013   HGB 14.6 02/27/2013   HCT 44.5 02/27/2013   MCV 102.4* 02/27/2013   PLT 293.0 02/27/2013   Lab Results  Component Value Date   CREATININE 1.0 02/27/2013   BUN 17 02/27/2013   NA 139 02/27/2013   K 4.8 02/27/2013   CL 107 02/27/2013   CO2 27 02/27/2013   Lab Results  Component Value Date   ALT 24 02/27/2013   AST 29 02/27/2013   ALKPHOS 36* 02/27/2013   BILITOT 1.5* 02/27/2013   Lab Results  Component Value Date   CHOL 135 02/27/2013   Lab Results  Component Value Date   HDL 60.70 02/27/2013   Lab Results  Component Value Date   LDLCALC 69 02/27/2013   Lab Results  Component Value Date   TRIG 27.0 02/27/2013   Lab Results  Component Value Date   CHOLHDL 2 02/27/2013   Lab Results  Component Value Date   PSA 0.44 02/27/2013   PSA 0.43  08/09/2010   PSA 0.39 08/05/2009    ASSESSMENT AND PLAN:   Transfer pt;  1) Tarsal tunnel syndrome, right.  Resulting in numbness of plantar surface of R foot. This is impairing his balance/gait/use of motor vehicle, has led to occasional fall. Will ask orthopedist to see him.  2) Health maintenance exam: Reviewed age and gender appropriate health maintenance issues (prudent diet, regular exercise, health risks of tobacco and excessive alcohol, use of seatbelts, fire alarms in home, use of sunscreen).  Also reviewed age and gender appropriate health screening as well as vaccine recommendations. Health panel labs today.   Pt no longer a candidate for prostate cancer screening (shared decision making approach apparently was used to arrive at this choice at last year's CPE). Next colonoscopy 2021. Prevnar  13 IM given today.  An After Visit Summary was printed and given to the patient.  Return in about 1 year (around 05/20/2015) for annual CPE (fasting).

## 2014-05-24 DIAGNOSIS — M25512 Pain in left shoulder: Secondary | ICD-10-CM | POA: Diagnosis not present

## 2014-05-24 DIAGNOSIS — M24271 Disorder of ligament, right ankle: Secondary | ICD-10-CM | POA: Diagnosis not present

## 2014-05-24 DIAGNOSIS — M1711 Unilateral primary osteoarthritis, right knee: Secondary | ICD-10-CM | POA: Diagnosis not present

## 2014-05-24 DIAGNOSIS — M1712 Unilateral primary osteoarthritis, left knee: Secondary | ICD-10-CM | POA: Diagnosis not present

## 2014-05-25 ENCOUNTER — Telehealth: Payer: Self-pay | Admitting: Family Medicine

## 2014-05-25 ENCOUNTER — Telehealth: Payer: Self-pay | Admitting: *Deleted

## 2014-05-25 NOTE — Telephone Encounter (Signed)
Patient called in stated Dr. Berenice Primas with [facility] recommended that patient see Dr. Jacelyn Grip. Patient states Dr. Berenice Primas wants him to see a "nerve specialist", he did not feel that it is an ortho issue. Patient does not know which practice Dr. Jacelyn Grip is with. Hopefully this is noted in Dr. Berenice Primas notes.

## 2014-05-25 NOTE — Telephone Encounter (Signed)
Santiago Glad from [facility] [phone]) called stating that she could not find the tubes for pts blood work ordered on 05/20/14. I looked in the lab and did not see anything. She stated that she was going to take the order out and wanted me to let Lattie Haw know.

## 2014-05-26 ENCOUNTER — Other Ambulatory Visit: Payer: Self-pay | Admitting: Family Medicine

## 2014-05-26 DIAGNOSIS — R200 Anesthesia of skin: Secondary | ICD-10-CM

## 2014-05-26 NOTE — Telephone Encounter (Signed)
Pls call pt and tell him I ordered a referral for him to see a [facility] neurologist. Also, he was supposed to have gotten his blood drawn when he was here last visit for his physical.  It doesn't look like the blood got drawn that day.  Pls tell him he can drop by for lab visit when fasting at the next opportunity he gets and we'll get these labs.-thx

## 2014-05-26 NOTE — Telephone Encounter (Signed)
I don't think this patient stopped at the lab on the day of his visit.  I looked and there were orders in but no blood drawn.  Please advise.

## 2014-05-26 NOTE — Telephone Encounter (Signed)
Pt advised and voiced understanding.   

## 2014-05-26 NOTE — Telephone Encounter (Signed)
Neuro referral ordered and Heather notified pt today.

## 2014-05-27 LAB — LIPID PANEL
Cholesterol: 136 mg/dL (ref 0–200)
HDL: 62.5 mg/dL (ref >39.00)
LDL CALC: 60 mg/dL (ref 0–99)
NonHDL: 73.5
Total CHOL/HDL Ratio: 2
Triglycerides: 67 mg/dL (ref 0.0–149.0)
VLDL: 13.4 mg/dL (ref 0.0–40.0)

## 2014-05-27 LAB — CBC WITH DIFFERENTIAL/PLATELET
Basophils Absolute: 0 10*3/uL (ref 0.0–0.1)
Basophils Relative: 0.7 % (ref 0.0–3.0)
Eosinophils Absolute: 0.3 10*3/uL (ref 0.0–0.7)
Eosinophils Relative: 4.7 % (ref 0.0–5.0)
HEMATOCRIT: 44.4 % (ref 39.0–52.0)
Hemoglobin: 14.9 g/dL (ref 13.0–17.0)
LYMPHS ABS: 2.9 10*3/uL (ref 0.7–4.0)
Lymphocytes Relative: 43.8 % (ref 12.0–46.0)
MCHC: 33.6 g/dL (ref 30.0–36.0)
MCV: 99.8 fl (ref 78.0–100.0)
Monocytes Absolute: 0.9 10*3/uL (ref 0.1–1.0)
Monocytes Relative: 14.1 % — ABNORMAL HIGH (ref 3.0–12.0)
NEUTROS PCT: 36.7 % — AB (ref 43.0–77.0)
Neutro Abs: 2.4 10*3/uL (ref 1.4–7.7)
Platelets: 286 10*3/uL (ref 150.0–400.0)
RBC: 4.45 Mil/uL (ref 4.22–5.81)
RDW: 14.3 % (ref 11.5–15.5)
WBC: 6.5 10*3/uL (ref 4.0–10.5)

## 2014-05-27 LAB — COMPREHENSIVE METABOLIC PANEL
ALT: 18 U/L (ref 0–53)
AST: 24 U/L (ref 0–37)
Albumin: 3.9 g/dL (ref 3.5–5.2)
Alkaline Phosphatase: 31 U/L — ABNORMAL LOW (ref 39–117)
BILIRUBIN TOTAL: 1.3 mg/dL — AB (ref 0.2–1.2)
BUN: 20 mg/dL (ref 6–23)
CALCIUM: 9.2 mg/dL (ref 8.4–10.5)
CHLORIDE: 105 meq/L (ref 96–112)
CO2: 27 meq/L (ref 19–32)
CREATININE: 1.01 mg/dL (ref 0.40–1.50)
GFR: 76.76 mL/min (ref >60.00)
Glucose, Bld: 101 mg/dL — ABNORMAL HIGH (ref 70–99)
Potassium: 4.7 mEq/L (ref 3.5–5.1)
SODIUM: 137 meq/L (ref 135–145)
Total Protein: 7 g/dL (ref 6.0–8.3)

## 2014-05-27 LAB — TSH: TSH: 0.71 u[IU]/mL (ref 0.35–4.50)

## 2014-05-27 NOTE — Addendum Note (Signed)
Addended by: Ralph Dowdy on: 05/27/2014 07:56 AM   Modules accepted: Orders

## 2014-07-16 ENCOUNTER — Other Ambulatory Visit (INDEPENDENT_AMBULATORY_CARE_PROVIDER_SITE_OTHER): Payer: Commercial Managed Care - HMO

## 2014-07-16 ENCOUNTER — Ambulatory Visit (INDEPENDENT_AMBULATORY_CARE_PROVIDER_SITE_OTHER): Payer: Commercial Managed Care - HMO | Admitting: Neurology

## 2014-07-16 ENCOUNTER — Encounter: Payer: Self-pay | Admitting: Neurology

## 2014-07-16 VITALS — BP 110/76 | HR 79 | Resp 16 | Ht 69.75 in | Wt 188.0 lb

## 2014-07-16 DIAGNOSIS — G609 Hereditary and idiopathic neuropathy, unspecified: Secondary | ICD-10-CM | POA: Diagnosis not present

## 2014-07-16 DIAGNOSIS — R278 Other lack of coordination: Secondary | ICD-10-CM | POA: Diagnosis not present

## 2014-07-16 LAB — VITAMIN B12: Vitamin B-12: 452 pg/mL (ref 211–911)

## 2014-07-16 LAB — SEDIMENTATION RATE: Sed Rate: 8 mm/hr (ref 0–22)

## 2014-07-16 NOTE — Patient Instructions (Signed)
1.  EMG of the legs 2.  Physical therapy for gait training  3.  Check blood work 4.  Return to clinic 64-months

## 2014-07-16 NOTE — Progress Notes (Signed)
[facility] Clinic Note - Initial Visit   Date: 07/16/2014   Paul Knight MRN: [id number] DOB: [birth year]   Dear Dr. Anitra Lauth:  Thank you for your kind referral of Paul Knight for consultation of right foot paresthesias. Although his history is well known to you, please allow [place] to reiterate it for the purpose of our medical record. The patient was accompanied to the clinic by wife who also provides collateral information.     History of Present Illness: Paul Knight is a 74 y.o. right-handed Caucasian male with depression presenting for evaluation of right foot paresthesias.  Starting around 2013, he developed slow onset of numbness of the soles of both feet.  Symptoms were intermittent at first and progressively worsened such that his numbness is now constant and he has tingling sensation and involves the top and bottom of his feet up to his ankles.  He now cannot feel the brake pedal very well.  He endorses mild problems with balance.  No weakness or falls. His right foot gets especially worse when he walks, but he is still staying active and goes to the gym three times per week.  No alleviating factors.  Denies any painful paresthesias or back pain.  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   I have reviewed his prior clinic note by his PCP.  Patient was initially thought to have tarsal tunnel syndrome so referred to orthopeadics.  Dr. Berenice Primas with orthopedic surgery then referred patient to me for further neurological evaluation.      Out-side paper records, electronic medical record, and images have been reviewed where available and summarized as:  Lab Results  Component Value Date   TSH 0.71 05/27/2014    Past Medical History  Diagnosis Date  . 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 Erectile dysfunction     Past Surgical History  Procedure Laterality Date  . Appendectomy  1970s  .  Tonsillectomy  as a child  . Colonoscopy  01/08/2009    Normal; recall 10 yrs     Medications:  Current Outpatient Prescriptions on File Prior to Visit  Medication Sig Dispense Refill  . aspirin 81 MG tablet Take 81 mg by mouth daily.      . sildenafil (VIAGRA) 100 MG tablet Take 1 tablet (100 mg total) by mouth daily as needed. 10 tablet 3   No current facility-administered medications on file prior to visit.    Allergies: No Known Allergies  Family History: Family History  Problem Relation Age of Onset  . Heart failure Mother   . Heart attack Mother   . Heart disease Brother     Social History: History   Social History  . Marital Status: Married    Spouse Name: N/A  . Number of Children: 4  . Years of Education: N/A   Occupational History  . Retired    Social History Main Topics  . Smoking status: Former Smoker    Types: Cigarettes  . Smokeless tobacco: Never Used  . Alcohol Use: 0.6 - 1.2 oz/week    1-2 Glasses of wine per week  . Drug Use: No  . Sexual Activity: Not on file   Other Topics Concern  . Not on file   Social History Narrative   Married, 4 children in the [place] area.  Grandchildren x 7.   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   No  tobacco.  Alcohol 2 glasses wine/day.   Exercise: [employer], walking in park, stationary bike at home: 3 days per week for 1 hour.    Review of Systems:  CONSTITUTIONAL: No fevers, chills, night sweats, or weight loss.   EYES: No visual changes or eye pain ENT: No hearing changes.  No history of nose bleeds.   RESPIRATORY: No cough, wheezing and shortness of breath.   CARDIOVASCULAR: Negative for chest pain, and palpitations.   GI: Negative for abdominal discomfort, blood in stools or black stools.  No recent change in bowel habits.   GU:  No history of incontinence.   MUSCLOSKELETAL: No history of joint pain or swelling.  No myalgias.   SKIN: Negative for lesions, rash, and itching.     HEMATOLOGY/ONCOLOGY: Negative for prolonged bleeding, bruising easily, and swollen nodes.  No history of cancer.   ENDOCRINE: Negative for cold or heat intolerance, polydipsia or goiter.   PSYCH:  No depression or anxiety symptoms.   NEURO: As Above.   Vital Signs:  BP 110/76 mmHg  Pulse 79  Resp 16  Ht 5' 9.75" (1.772 m)  Wt 188 lb (85.276 kg)  BMI 27.16 kg/m2  SpO2 96% Pain Scale:  on a scale of 0-10   General Medical Exam:   General:  Well appearing, comfortable.   Eyes/ENT: see cranial nerve examination.   Neck: No masses appreciated.  Full range of motion without tenderness.  No carotid bruits. Respiratory:  Clear to auscultation, good air entry bilaterally.   Cardiac:  Regular rate and rhythm, no murmur.   Extremities:  No deformities, edema, or skin discoloration.  Skin:  No rashes or lesions.  Neurological Exam: MENTAL STATUS including orientation to time, place, person, recent and remote memory, attention span and concentration, language, and fund of knowledge is normal.  Speech is not dysarthric.  CRANIAL NERVES: II:  No visual field defects.  Unremarkable fundi.   III-IV-VI: Pupils equal round and reactive to light.  Normal conjugate, extra-ocular eye movements in all directions of gaze.  No nystagmus.  No ptosis.   V:  Normal facial sensation.    VII:  Normal facial symmetry and movements.  No pathologic facial reflexes.  VIII:  Normal hearing and vestibular function.   IX-X:  Normal palatal movement.   XI:  Normal shoulder shrug and head rotation.   XII:  Normal tongue strength and range of motion, no deviation or fasciculation.  MOTOR:  No atrophy, fasciculations or abnormal movements.  No pronator drift.  Tone is normal.    Right Upper Extremity:    Left Upper Extremity:    Deltoid  5/5   Deltoid  5/5   Biceps  5/5   Biceps  5/5   Triceps  5/5   Triceps  5/5   Wrist extensors  5/5   Wrist extensors  5/5   Wrist flexors  5/5   Wrist flexors  5/5   Finger  extensors  5/5   Finger extensors  5/5   Finger flexors  5/5   Finger flexors  5/5   Dorsal interossei  5/5   Dorsal interossei  5/5   Abductor pollicis  5/5   Abductor pollicis  5/5   Tone (Ashworth scale)  0  Tone (Ashworth scale)  0   Right Lower Extremity:    Left Lower Extremity:    Hip flexors  5/5   Hip flexors  5/5   Hip extensors  5/5   Hip extensors  5/5   Knee flexors  5/5   Knee flexors  5/5   Knee extensors  5/5   Knee extensors  5/5   Dorsiflexors  5/5   Dorsiflexors  5/5   Plantarflexors  5/5   Plantarflexors  5/5   Toe extensors  4+/5   Toe extensors  4+/5   Toe flexors  4+/5   Toe flexors  4+/5   Tone (Ashworth scale)  0  Tone (Ashworth scale)  0   MSRs:  Right                                                                 Left brachioradialis 2+  brachioradialis 2+  biceps 2+  biceps 2+  triceps 2+  triceps 2+  patellar 2+  patellar 2+  ankle jerk 0  ankle jerk 1+  Hoffman no  Hoffman no  plantar response down  plantar response down   SENSORY: Reduced sensation to all modalities distal to ankles bilaterally.  Romberg's sign preesnt.   COORDINATION/GAIT: Normal finger-to- nose-finger and heel-to-shin.  Intact rapid alternating movements bilaterally.  Able to rise from a chair without using arms.  Gait narrow based and stable. Unsteady with tandem gait.  IMPRESSION: Paul Knight is a 74 year-old gentleman presenting for evaluation of bilateral feet paresthesias.  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 I had extensive discussion with the patient regarding the pathogenesis, etiology, management, and natural course of neuropathy. Neuropathy tends to be slowly progressive, especially if a treatable etiology is not identified.  I would like to test for treatable causes of neuropathy. I discussed that in the vast majority of cases, despite checking for reversible causes, we are unable to find the underlying etiology and  management is symptomatic.  Further, I do not feel that his alcohol intake of 1-2 glasses of wine nightly is enough to be causing his neuropathy.   With progression of paresthesias involving the dorsum of the foot, symptoms are not consistent with tarsal tunnel syndrome.  PLAN/RECOMMENDATIONS:  1.  Check ESR, 2hr glucose tolerance test, vitamin B12, copper, SPEP/UPEP with IFE, vitamin B1 2.  EMG of the legs 3.  Physical therapy for gait training 4.  Return to clinic in 2-3 monts.   The duration of this appointment visit was 40 minutes of face-to-face time with the patient.  Greater than 50% of this time was spent in counseling, explanation of diagnosis, planning of further management, and coordination of care.   Thank you for allowing me to participate in patient's care.  If I can answer any additional questions, I would be pleased to do so.    Sincerely,    Yariela Tison K. Posey Pronto, DO

## 2014-07-19 LAB — COPPER, SERUM: COPPER: 86 ug/dL (ref 70–175)

## 2014-07-20 ENCOUNTER — Ambulatory Visit (INDEPENDENT_AMBULATORY_CARE_PROVIDER_SITE_OTHER): Payer: Commercial Managed Care - HMO | Admitting: Neurology

## 2014-07-20 ENCOUNTER — Other Ambulatory Visit: Payer: Commercial Managed Care - HMO

## 2014-07-20 DIAGNOSIS — G609 Hereditary and idiopathic neuropathy, unspecified: Secondary | ICD-10-CM

## 2014-07-20 DIAGNOSIS — M5417 Radiculopathy, lumbosacral region: Secondary | ICD-10-CM

## 2014-07-20 DIAGNOSIS — R278 Other lack of coordination: Secondary | ICD-10-CM

## 2014-07-20 LAB — UIFE/LIGHT CHAINS/TP QN, 24-HR UR
ALPHA 2 UR: DETECTED — AB
Albumin, U: DETECTED
Alpha 1, Urine: DETECTED — AB
Beta, Urine: DETECTED — AB
Gamma Globulin, Urine: DETECTED — AB
TOTAL PROTEIN, URINE-UPE24: 5 mg/dL (ref 5–25)

## 2014-07-20 LAB — GLUCOSE TOLERANCE, 2 HOURS
GLUCOSE 1 HOUR GTT: 154 mg/dL
GLUCOSE, 2 HOUR: 50 mg/dL
GLUCOSE, FASTING: 122 mg/dL — AB (ref 70–99)

## 2014-07-20 NOTE — Procedures (Signed)
[facility]  [address]  [address], [place] [address] Tel: [phone] Fax:  [phone] Test Date:  07/20/2014  Patient: Paul Knight DOB: 18-Jul-1940 Physician: Narda Amber, DO  Sex: Male Height: 5\' 9"  Ref Phys: Narda Amber, DO  ID#: [id number] Temp: 32.6C Technician:    Patient Complaints: This is a 74 year-old gentleman presenting for evaluation of paresthesias of the feet, worse on the right.  NCV & EMG Findings: Extensive electrodiagnostic testing of the right lower extremity and additional studies of the left shows:  1. Bilateral sural and superficial peroneal sensory responses are absent. 2. Bilateral tibial motor responses are reduced in amplitude and worse on the right. Bilateral peroneal motor responses at the extensor digitorum brevis are absent.  Peroneal motor responses recording at the tibialis anterior is reduced on the right and within normal limits on the left side. 3. Bilateral H reflex studies are prolonged. 4. Chronic motor axon loss changes are seen affecting the L5 myotomes bilaterally, with active denervation affecting the right tibialis anterior muscle.  Impression: The electrophysiologic findings are most consistent with a generalized sensorimotor polyneuropathy, axon loss in type, affecting the lower extremities. Overall, these findings are moderate in degree electrically.  A superimposed subacute bilateral L5 radiculopathy is also likely and worse on the right.   ___________________________ Narda Amber, DO    Nerve Conduction Studies Anti Sensory Summary Table   Site NR Peak (ms) Norm Peak (ms) P-T Amp (V) Norm P-T Amp  Left Sup Peroneal Anti Sensory (Ant Lat Mall)  32.6C  12 cm NR  <4.6  >3  Right Sup Peroneal Anti Sensory (Ant Lat Mall)  12 cm NR  <4.6  >3  Left Sural Anti Sensory (Lat Mall)  32.6C  Calf NR  <4.6  >3  Right Sural Anti Sensory (Lat Mall)  Calf NR  <4.6  >3   Motor Summary Table   Site NR Onset (ms)  Norm Onset (ms) O-P Amp (mV) Norm O-P Amp Site1 Site2 Delta-0 (ms) Dist (cm) Vel (m/s) Norm Vel (m/s)  Left Peroneal Motor (Ext Dig Brev)  32.6C  Ankle NR  <6.0  >2.5 B Fib Ankle  0.0  >40  B Fib NR     Poplt B Fib  0.0  >40  Poplt NR            Right Peroneal Motor (Ext Dig Brev)  Ankle NR  <6.0  >2.5 B Fib Ankle  0.0  >40  B Fib NR     Poplt B Fib  0.0  >40  Poplt NR            Left Peroneal TA Motor (Tib Ant)  32.6C  Fib Head    3.2 <4.5 4.1 >3 Poplit Fib Head 1.9 10.0 53 >40  Poplit    5.1  4.4         Right Peroneal TA Motor (Tib Ant)  Fib Head    2.9 <4.5 2.7 >3 Poplit Fib Head 2.1 10.0 48 >40  Poplit    5.0  2.0         Left Tibial Motor (Abd Hall Brev)  32.6C  Ankle    4.9 <6.0 1.9 >4 Knee Ankle 11.4 46.0 40 >40  Knee    16.3  1.1         Right Tibial Motor (Abd Hall Brev)  Ankle    4.8 <6.0 0.7 >4 Knee Ankle 11.8 48.0 41 >40  Knee  16.6  0.5           H Reflex Studies  NR H-Lat (ms) Lat Norm (ms) L-R H-Lat (ms)  Left Tibial (Gastroc)  32.6C     38.64 <35 1.90  Right Tibial (Gastroc)     40.54 <35 1.90   EMG   Side Muscle Ins Act Fibs Psw Fasc Number Recrt Dur Dur. Amp Amp. Poly Poly. Comment  Right GluteusMed Nml Nml Nml Nml 1- Rapid Some 1+ Some 1+ Nml Nml N/A  Right AntTibialis Nml 1+ Nml Nml 3- Rapid Most 1+ Most 1+ Most 1+ N/A  Right Gastroc Nml Nml Nml Nml Nml Nml Nml Nml Nml Nml Nml Nml N/A  Right Flex Dig Long Nml Nml Nml Nml 2- Rapid Some 1+ Some 1+ Nml Nml N/A  Right RectFemoris Nml Nml Nml Nml Nml Nml Nml Nml Nml Nml Nml Nml N/A  Right BicepsFemS Nml Nml Nml Nml Nml Nml Nml Nml Nml Nml Nml Nml N/A  Right Lumbo Parasp Low Nml Nml Nml Nml Nml Nml Nml Nml Nml Nml Nml Nml N/A  Left AntTibialis Nml Nml Nml Nml 2- Rapid Some 1+ Some 1+ Nml Nml N/A  Left Gastroc Nml Nml Nml Nml Nml Nml Nml Nml Nml Nml Nml Nml N/A  Left GluteusMed Nml Nml Nml Nml 1- Rapid Some 1+ Some 1+ Nml Nml N/A      Waveforms:

## 2014-07-21 ENCOUNTER — Telehealth: Payer: Self-pay | Admitting: Neurology

## 2014-07-21 LAB — VITAMIN B1: VITAMIN B1 (THIAMINE): 21 nmol/L (ref 8–30)

## 2014-07-21 NOTE — Telephone Encounter (Signed)
Called and informed patient that his EMG shows peripheral neuropathy (axonal) as well as L5 radiculopathy.  He denies any low back pain or shooting pain into his legs, so will hold off on imaging.  If he develops new low back pain, worsening weakness, or painful paresthesia, MRI lumbar spine can be ordered.  There is evidence of impaired fasting glucose without signs of diabetes, so I encouraged him to be cautious of limiting his sweets and carbohydrates.  Vitamin B12 and ESR is within normal limits.  A few remaining tests are still pending.    I also encouraged him to start gait training.  If no improvement or worsening symptoms, he will contact us for return visit.  Omayra Tulloch K. Posey Pronto, DO

## 2014-07-22 LAB — SPEP & IFE WITH QIG
ALPHA-1-GLOBULIN: 0.2 g/dL (ref 0.2–0.3)
ALPHA-2-GLOBULIN: 0.7 g/dL (ref 0.5–0.9)
Albumin ELP: 3.9 g/dL (ref 3.8–4.8)
Beta 2: 0.5 g/dL (ref 0.2–0.5)
Beta Globulin: 0.4 g/dL (ref 0.4–0.6)
GAMMA GLOBULIN: 1.3 g/dL (ref 0.8–1.7)
IGA: 421 mg/dL — AB (ref 68–379)
IgG (Immunoglobin G), Serum: 1290 mg/dL (ref 650–1600)
IgM, Serum: 120 mg/dL (ref 41–251)
TOTAL PROTEIN, SERUM ELECTROPHOR: 7 g/dL (ref 6.1–8.1)

## 2014-08-04 ENCOUNTER — Encounter: Payer: Self-pay | Admitting: Family Medicine

## 2014-08-04 ENCOUNTER — Ambulatory Visit (INDEPENDENT_AMBULATORY_CARE_PROVIDER_SITE_OTHER): Payer: Commercial Managed Care - HMO | Admitting: Family Medicine

## 2014-08-04 VITALS — BP 119/77 | HR 77 | Temp 98.2°F | Resp 16 | Ht 69.75 in | Wt 187.0 lb

## 2014-08-04 DIAGNOSIS — N485 Ulcer of penis: Secondary | ICD-10-CM

## 2014-08-04 DIAGNOSIS — B882 Other arthropod infestations: Secondary | ICD-10-CM | POA: Diagnosis not present

## 2014-08-04 MED ORDER — DOXYCYCLINE HYCLATE 100 MG PO TABS: 100.0000 mg | ORAL_TABLET | Freq: Two times a day (BID) | ORAL | Status: DC

## 2014-08-04 NOTE — Progress Notes (Signed)
Pre visit review using our clinic review tool, if applicable. No additional management support is needed unless otherwise documented below in the visit note. 

## 2014-08-04 NOTE — Patient Instructions (Signed)
Dab aquafor on tip of penis ulcer, then cover with gauze.

## 2014-08-04 NOTE — Progress Notes (Signed)
OFFICE NOTE  08/04/2014  CC:  Chief Complaint  Patient presents with  . Insect Bite    x 2 weeks ago, fever 100F, fatigue, dull HA, chills and sweats x 3 days  . Skin Problem    lesion on genital area x 2 months   HPI: Patient is a 74 y.o. Caucasian male who is here for onset of dull HA, fatigue, subj f/c/sweats, low grade fevers 4 days ago.   No ST, cough, runny nose.  One week prior to his illness he got a tick bite on R low back region.  He also has a sore on head of penis when he got his penis caught in zipper a couple months ago, not healing.  No pain, just still bleeding occasionally when he cleans it. Currently not putting anything on it but in the past he put neosporin on it.  He is uncircumcised and the area stays moist all the time.  ROS: chronic urinary hesitancy.  No dysuria.  No joint or muscle aches.  No n/v.  Appetite is down.  Pertinent PMH:  Past Medical History  Diagnosis Date  . 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 Erectile dysfunction    Past Surgical History  Procedure Laterality Date  . Appendectomy  1970s  . Tonsillectomy  as a child  . Colonoscopy  01/08/2009    Normal; recall 10 yrs    MEDS:  Outpatient Prescriptions Prior to Visit  Medication Sig Dispense Refill  . aspirin 81 MG tablet Take 81 mg by mouth daily.      . Multiple Vitamins-Minerals (MULTIVITAMIN WITH MINERALS) tablet Take 1 tablet by mouth daily.    . Omega-3 Fatty Acids (FISH OIL) 1000 MG CAPS Take 1,000 mg by mouth daily.    . sildenafil (VIAGRA) 100 MG tablet Take 1 tablet (100 mg total) by mouth daily as needed. 10 tablet 3   No facility-administered medications prior to visit.    PE: Blood pressure 119/77, pulse 77, temperature 98.2 F (36.8 C), temperature source Oral, resp. rate 16, height 5' 9.75" (1.772 m), weight 187 lb (84.823 kg), SpO2 98 %. Gen: Alert, well appearing.  Patient is oriented to person, place, time, and situation. AGT:XMIW: no injection, icteris, swelling, or  exudate.  EOMI, PERRLA. Mouth: lips without lesion/swelling.  Oral mucosa pink and moist. Oropharynx without erythema, exudate, or swelling.  Neck - No masses or thyromegaly or limitation in range of motion CV: RRR, no m/r/g.   LUNGS: CTA bilat, nonlabored resps, good aeration in all lung fields. EXT: no clubbing, cyanosis, or edema.  Skin - no sores or suspicious lesions or rashes or color changes EXCEPT for penis--dorsal aspect of glans with 2 mm shallow ulcer, pink in ulcer bed, no surrounding erythema, no induration, no tenderness.   IMPRESSION AND PLAN:  1) Tick born illness likely; doxycycline 100 mg bid x 7d.  Call if still having sx's at the end of this antibiotic course.  2) Penile ulcer secondary to trauma.  Instructions: Dab aquafor on tip of penis ulcer, then cover with gauze and pull foreskin over the gauze.  An After Visit Summary was printed and given to the patient.  FOLLOW UP: prn

## 2014-09-20 ENCOUNTER — Ambulatory Visit: Payer: Commercial Managed Care - HMO | Admitting: Neurology

## 2014-10-06 ENCOUNTER — Ambulatory Visit (INDEPENDENT_AMBULATORY_CARE_PROVIDER_SITE_OTHER): Payer: Commercial Managed Care - HMO

## 2014-10-06 DIAGNOSIS — Z23 Encounter for immunization: Secondary | ICD-10-CM | POA: Diagnosis not present

## 2015-05-20 ENCOUNTER — Encounter: Payer: Self-pay | Admitting: Family Medicine

## 2015-05-20 ENCOUNTER — Ambulatory Visit (INDEPENDENT_AMBULATORY_CARE_PROVIDER_SITE_OTHER): Payer: Commercial Managed Care - HMO | Admitting: Family Medicine

## 2015-05-20 VITALS — BP 140/82 | HR 68 | Temp 97.9°F | Resp 16 | Ht 69.5 in | Wt 192.2 lb

## 2015-05-20 DIAGNOSIS — Z0000 Encounter for general adult medical examination without abnormal findings: Secondary | ICD-10-CM

## 2015-05-20 DIAGNOSIS — Z1283 Encounter for screening for malignant neoplasm of skin: Secondary | ICD-10-CM | POA: Diagnosis not present

## 2015-05-20 LAB — CBC WITH DIFFERENTIAL/PLATELET
BASOS ABS: 0.1 10*3/uL (ref 0.0–0.1)
Basophils Relative: 0.8 % (ref 0.0–3.0)
Eosinophils Absolute: 0.3 10*3/uL (ref 0.0–0.7)
Eosinophils Relative: 4.4 % (ref 0.0–5.0)
HCT: 42.9 % (ref 39.0–52.0)
Hemoglobin: 14.4 g/dL (ref 13.0–17.0)
LYMPHS ABS: 3.2 10*3/uL (ref 0.7–4.0)
Lymphocytes Relative: 42.3 % (ref 12.0–46.0)
MCHC: 33.5 g/dL (ref 30.0–36.0)
MCV: 99.3 fl (ref 78.0–100.0)
MONOS PCT: 10.8 % (ref 3.0–12.0)
Monocytes Absolute: 0.8 10*3/uL (ref 0.1–1.0)
NEUTROS PCT: 41.7 % — AB (ref 43.0–77.0)
Neutro Abs: 3.1 10*3/uL (ref 1.4–7.7)
Platelets: 309 10*3/uL (ref 150.0–400.0)
RBC: 4.31 Mil/uL (ref 4.22–5.81)
RDW: 14.3 % (ref 11.5–15.5)
WBC: 7.5 10*3/uL (ref 4.0–10.5)

## 2015-05-20 LAB — COMPREHENSIVE METABOLIC PANEL
ALT: 20 U/L (ref 0–53)
AST: 26 U/L (ref 0–37)
Albumin: 3.7 g/dL (ref 3.5–5.2)
Alkaline Phosphatase: 31 U/L — ABNORMAL LOW (ref 39–117)
BILIRUBIN TOTAL: 1.3 mg/dL — AB (ref 0.2–1.2)
BUN: 21 mg/dL (ref 6–23)
CO2: 27 mEq/L (ref 19–32)
Calcium: 9 mg/dL (ref 8.4–10.5)
Chloride: 106 mEq/L (ref 96–112)
Creatinine, Ser: 0.9 mg/dL (ref 0.40–1.50)
GFR: 87.45 mL/min (ref >60.00)
GLUCOSE: 96 mg/dL (ref 70–99)
Potassium: 5.1 mEq/L (ref 3.5–5.1)
SODIUM: 139 meq/L (ref 135–145)
TOTAL PROTEIN: 6.6 g/dL (ref 6.0–8.3)

## 2015-05-20 LAB — LIPID PANEL
Cholesterol: 143 mg/dL (ref 0–200)
HDL: 53.3 mg/dL (ref >39.00)
LDL Cholesterol: 76 mg/dL (ref 0–99)
NONHDL: 89.98
Total CHOL/HDL Ratio: 3
Triglycerides: 69 mg/dL (ref 0.0–149.0)
VLDL: 13.8 mg/dL (ref 0.0–40.0)

## 2015-05-20 LAB — TSH: TSH: 1.2 u[IU]/mL (ref 0.35–4.50)

## 2015-05-20 NOTE — Progress Notes (Signed)
Pre visit review using our clinic review tool, if applicable. No additional management support is needed unless otherwise documented below in the visit note. 

## 2015-05-20 NOTE — Progress Notes (Signed)
Office Note 05/20/2015  CC:  Chief Complaint  Patient presents with  . Annual Exam    Pt is fasting.     HPI:  Paul Knight is a 75 y.o. White male who is here for annual health maintenance exam. No acute complaints. Stays active, diet fairly healthy. Dental visits +.  No acute complaints.   Past Medical History  Diagnosis Date  . 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 Erectile dysfunction   . Numbness in feet     ? Tarsal tunnel syndrome.  Ortho and neuro could not determine dx.      Past Surgical History  Procedure Laterality Date  . Appendectomy  1970s  . Tonsillectomy  as a child  . Colonoscopy  01/08/2009    Normal; recall 10 yrs    Family History  Problem Relation Age of Onset  . Heart failure Mother   . Heart attack Mother     Deceased, 61  . Heart disease Brother   . Other Father     Deceased, 47  . Healthy Son   . Thyroid disease Daughter     Social History   Social History  . Marital Status: Married    Spouse Name: N/A  . Number of Children: 4  . Years of Education: N/A   Occupational History  . Retired    Social History Main Topics  . Smoking status: Former Smoker -- 0.25 packs/day for 10 years    Types: Cigarettes  . Smokeless tobacco: Never Used  . Alcohol Use: 0.6 - 1.2 oz/week    1-2 Glasses of wine per week     Comment: 2 glasses of wine nightly x 5 years  . Drug Use: No  . Sexual Activity: Not on file   Other Topics Concern  . Not on file   Social History Narrative   Married, 4 children in the [place] area.  Grandchildren x 7.   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   No tobacco.  Alcohol 2 glasses wine/day.   Exercise: [employer], walking in park, stationary bike at home: 3 days per week for 1 hour.   MEDS: not taking doxycycline listed below Outpatient Prescriptions Prior to Visit  Medication Sig Dispense Refill  . aspirin 81 MG tablet Take 81 mg by mouth daily.      . Omega-3 Fatty Acids (FISH OIL) 1000 MG CAPS Take  1,000 mg by mouth daily.    . sildenafil (VIAGRA) 100 MG tablet Take 1 tablet (100 mg total) by mouth daily as needed. 10 tablet 3  . doxycycline (VIBRA-TABS) 100 MG tablet Take 1 tablet (100 mg total) by mouth 2 (two) times daily. (Patient not taking: Reported on 05/20/2015) 14 tablet 0  . Multiple Vitamins-Minerals (MULTIVITAMIN WITH MINERALS) tablet Take 1 tablet by mouth daily. Reported on 05/20/2015     No facility-administered medications prior to visit.    No Known Allergies  ROS Review of Systems  Constitutional: Negative for fever, chills, appetite change and fatigue.  HENT: Negative for congestion, dental problem, ear pain and sore throat.   Eyes: Negative for discharge, redness and visual disturbance.  Respiratory: Negative for cough, chest tightness, shortness of breath and wheezing.   Cardiovascular: Negative for chest pain, palpitations and leg swelling.  Gastrointestinal: Negative for nausea, vomiting, abdominal pain, diarrhea and blood in stool.  Genitourinary: Negative for dysuria, urgency, frequency, hematuria, flank pain and difficulty urinating.  Musculoskeletal: Negative for myalgias, back pain, joint swelling, arthralgias and  neck stiffness.  Skin: Negative for pallor and rash.  Neurological: Negative for dizziness, speech difficulty, weakness and headaches.  Hematological: Negative for adenopathy. Does not bruise/bleed easily.  Psychiatric/Behavioral: Negative for confusion and sleep disturbance. The patient is not nervous/anxious.   NO calf pain at rest or with walking  PE; Blood pressure 140/82, pulse 68, temperature 97.9 F (36.6 C), temperature source Oral, resp. rate 16, height 5' 9.5" (1.765 m), weight 192 lb 4 oz (87.204 kg), SpO2 99 %. Gen: Alert, well appearing.  Patient is oriented to person, place, time, and situation. AFFECT: pleasant, lucid thought and speech. ENT: Ears: EACs clear, normal epithelium.  TMs with good light reflex and landmarks  bilaterally.  Eyes: no injection, icteris, swelling, or exudate.  EOMI, PERRLA. Nose: no drainage or turbinate edema/swelling.  No injection or focal lesion.  Mouth: lips without lesion/swelling.  Oral mucosa pink and moist.  Dentition intact and without obvious caries or gingival swelling.  Oropharynx without erythema, exudate, or swelling.  Neck: supple/nontender.  No LAD, mass, or TM.  Carotid pulses 2+ bilaterally, without bruits. CV: RRR, no m/r/g.   LUNGS: CTA bilat, nonlabored resps, good aeration in all lung fields. ABD: soft, NT, ND, BS normal.  No hepatospenomegaly or mass.  No bruits. EXT: no clubbing, cyanosis, or edema.  Musculoskeletal: no joint swelling, erythema, warmth, or tenderness.  ROM of all joints intact. Skin - he has many cherry angiomata and seborrheic keratoses.  Some of the seb kerat lesions are very darkly pigmented.  Pertinent labs:  Lab Results  Component Value Date   TSH 0.71 05/27/2014   Lab Results  Component Value Date   WBC 6.5 05/27/2014   HGB 14.9 05/27/2014   HCT 44.4 05/27/2014   MCV 99.8 05/27/2014   PLT 286.0 05/27/2014   Lab Results  Component Value Date   CREATININE 1.01 05/27/2014   BUN 20 05/27/2014   NA 137 05/27/2014   K 4.7 05/27/2014   CL 105 05/27/2014   CO2 27 05/27/2014   Lab Results  Component Value Date   ALT 18 05/27/2014   AST 24 05/27/2014   ALKPHOS 31* 05/27/2014   BILITOT 1.3* 05/27/2014   Lab Results  Component Value Date   CHOL 136 05/27/2014   Lab Results  Component Value Date   HDL 62.50 05/27/2014   Lab Results  Component Value Date   LDLCALC 60 05/27/2014   Lab Results  Component Value Date   TRIG 67.0 05/27/2014   Lab Results  Component Value Date   CHOLHDL 2 05/27/2014   Lab Results  Component Value Date   PSA 0.44 02/27/2013   PSA 0.43 08/09/2010   PSA 0.39 08/05/2009    ASSESSMENT AND PLAN:   Health maintenance exam: Reviewed age and gender appropriate health maintenance issues  (prudent diet, regular exercise, health risks of tobacco and excessive alcohol, use of seatbelts, fire alarms in home, use of sunscreen).  Also reviewed age and gender appropriate health screening as well as vaccine recommendations. Vaccines UTD. Prostate ca screening: as of last year's CPE we decided he was no longer a candidate for prostate cancer screening. Colon cancer screening: next colonoscopy due after 01/2019.  I ordered referral today to dermatologist for skin exam/survey since he has MANY lesions and a FH of melanoma.  An After Visit Summary was printed and given to the patient.  FOLLOW UP:  Return in about 1 year (around 05/19/2016) for annual CPE (fasting).  Signed:  Crissie Sickles, MD  05/20/2015   

## 2015-05-24 ENCOUNTER — Encounter: Payer: Self-pay | Admitting: Family Medicine

## 2015-09-16 ENCOUNTER — Ambulatory Visit (INDEPENDENT_AMBULATORY_CARE_PROVIDER_SITE_OTHER): Payer: Commercial Managed Care - HMO

## 2015-09-16 DIAGNOSIS — Z23 Encounter for immunization: Secondary | ICD-10-CM

## 2016-05-21 ENCOUNTER — Encounter: Payer: Self-pay | Admitting: Family Medicine

## 2016-05-21 ENCOUNTER — Ambulatory Visit (INDEPENDENT_AMBULATORY_CARE_PROVIDER_SITE_OTHER): Payer: Medicare HMO | Admitting: Family Medicine

## 2016-05-21 VITALS — BP 99/64 | HR 71 | Temp 98.0°F | Resp 16 | Ht 69.5 in | Wt 193.8 lb

## 2016-05-21 DIAGNOSIS — M25552 Pain in left hip: Secondary | ICD-10-CM

## 2016-05-21 DIAGNOSIS — Z1283 Encounter for screening for malignant neoplasm of skin: Secondary | ICD-10-CM | POA: Diagnosis not present

## 2016-05-21 DIAGNOSIS — M25511 Pain in right shoulder: Secondary | ICD-10-CM | POA: Diagnosis not present

## 2016-05-21 DIAGNOSIS — Z0000 Encounter for general adult medical examination without abnormal findings: Secondary | ICD-10-CM | POA: Diagnosis not present

## 2016-05-21 DIAGNOSIS — L989 Disorder of the skin and subcutaneous tissue, unspecified: Secondary | ICD-10-CM | POA: Diagnosis not present

## 2016-05-21 LAB — COMPREHENSIVE METABOLIC PANEL
ALBUMIN: 4.1 g/dL (ref 3.5–5.2)
ALK PHOS: 29 U/L — AB (ref 39–117)
ALT: 22 U/L (ref 0–53)
AST: 28 U/L (ref 0–37)
BUN: 25 mg/dL — ABNORMAL HIGH (ref 6–23)
CO2: 25 mEq/L (ref 19–32)
Calcium: 9.2 mg/dL (ref 8.4–10.5)
Chloride: 105 mEq/L (ref 96–112)
Creatinine, Ser: 1 mg/dL (ref 0.40–1.50)
GFR: 77.23 mL/min (ref >60.00)
Glucose, Bld: 106 mg/dL — ABNORMAL HIGH (ref 70–99)
POTASSIUM: 4.2 meq/L (ref 3.5–5.1)
SODIUM: 137 meq/L (ref 135–145)
TOTAL PROTEIN: 7 g/dL (ref 6.0–8.3)
Total Bilirubin: 1.2 mg/dL (ref 0.2–1.2)

## 2016-05-21 LAB — LIPID PANEL
CHOLESTEROL: 126 mg/dL (ref 0–200)
HDL: 55.3 mg/dL (ref >39.00)
LDL Cholesterol: 55 mg/dL (ref 0–99)
NonHDL: 70.84
Total CHOL/HDL Ratio: 2
Triglycerides: 78 mg/dL (ref 0.0–149.0)
VLDL: 15.6 mg/dL (ref 0.0–40.0)

## 2016-05-21 LAB — CBC WITH DIFFERENTIAL/PLATELET
BASOS ABS: 0.1 10*3/uL (ref 0.0–0.1)
Basophils Relative: 0.8 % (ref 0.0–3.0)
EOS PCT: 2.9 % (ref 0.0–5.0)
Eosinophils Absolute: 0.2 10*3/uL (ref 0.0–0.7)
HCT: 43.3 % (ref 39.0–52.0)
Hemoglobin: 14.5 g/dL (ref 13.0–17.0)
LYMPHS ABS: 2.7 10*3/uL (ref 0.7–4.0)
Lymphocytes Relative: 37.7 % (ref 12.0–46.0)
MCHC: 33.4 g/dL (ref 30.0–36.0)
MCV: 101.3 fl — ABNORMAL HIGH (ref 78.0–100.0)
MONO ABS: 0.8 10*3/uL (ref 0.1–1.0)
Monocytes Relative: 11.3 % (ref 3.0–12.0)
NEUTROS PCT: 47.3 % (ref 43.0–77.0)
Neutro Abs: 3.5 10*3/uL (ref 1.4–7.7)
Platelets: 308 10*3/uL (ref 150.0–400.0)
RBC: 4.28 Mil/uL (ref 4.22–5.81)
RDW: 14 % (ref 11.5–15.5)
WBC: 7.3 10*3/uL (ref 4.0–10.5)

## 2016-05-21 NOTE — Progress Notes (Signed)
Subjective:   Paul Knight is a 76 y.o. male who presents for an Initial Medicare Annual Wellness Visit.  Review of Systems  No ROS.  Medicare Wellness Visit.  Cardiac Risk Factors include: advanced age (>68men, >32 women);family history of premature cardiovascular disease;male gender   Sleep patterns: Sleeps 6-7 hours, naps during the day. Feels rested. Up to void x 1.  Home Safety/Smoke Alarms:  Smoke detectors and security in place.  Living environment; residence and Firearm Safety: Lives with wife in 2 story home, uses rail. Feels safe in home.  Seat Belt Safety/Bike Helmet: Wears seat belt.   Counseling:   Eye Exam-Last exam > 2 years. Will make appointment with Dr. Jacinta Shoe exam < 1 year, goes yearly.   Male:   CCS-Colonoscopy 01/08/2009, normal.     PSA-  Lab Results  Component Value Date   PSA 0.44 02/27/2013   PSA 0.43 08/09/2010   PSA 0.39 08/05/2009      Objective:    Today's Vitals   05/21/16 0809  BP: 99/64  Pulse: 71  Resp: 16  Temp: 98 F (36.7 C)  TempSrc: Oral  SpO2: 98%  Weight: 193 lb 12 oz (87.9 kg)  Height: 5' 9.5" (1.765 m)   Body mass index is 28.2 kg/m.  Current Medications (verified) Outpatient Encounter Prescriptions as of 05/21/2016  Medication Sig  . aspirin 81 MG tablet Take 81 mg by mouth daily.    . Multiple Vitamin (MULTIVITAMIN) tablet Take 1 tablet by mouth daily.  . sildenafil (VIAGRA) 100 MG tablet Take 1 tablet (100 mg total) by mouth daily as needed.  . [DISCONTINUED] Omega-3 Fatty Acids (FISH OIL) 1000 MG CAPS Take 1,000 mg by mouth daily.   No facility-administered encounter medications on file as of 05/21/2016.     Allergies (verified) Patient has no known allergies.   History: Past Medical History:  Diagnosis Date  . 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 Erectile dysfunction   . Numbness in feet    ? Tarsal tunnel syndrome.  Ortho and neuro could not determine dx.     Past Surgical History:  Procedure  Laterality Date  . APPENDECTOMY  1970s  . COLONOSCOPY  01/08/2009   Normal; recall 10 yrs  . TONSILLECTOMY  as a child   Family History  Problem Relation Age of Onset  . Heart failure Mother   . Heart attack Mother        Deceased, 9  . Heart disease Brother   . Other Father        Deceased, 25  . Healthy Son   . Thyroid disease Daughter    Social History   Occupational History  . Retired    Social History Main Topics  . Smoking status: Former Smoker    Packs/day: 0.25    Years: 10.00    Types: Cigarettes  . Smokeless tobacco: Never Used  . Alcohol use 0.6 - 1.2 oz/week    1 - 2 Glasses of wine per week     Comment: 2 glasses of wine nightly x 5 years  . Drug use: No  . Sexual activity: Not on file   Tobacco Counseling Counseling given: No   Activities of Daily Living In your present state of health, do you have any difficulty performing the following activities: 05/21/2016  Hearing? N  Vision? N  Difficulty concentrating or making decisions? N  Walking or climbing stairs? N  Dressing or bathing? N  Doing errands, shopping?  N  Preparing Food and eating ? N  Using the Toilet? N  In the past six months, have you accidently leaked urine? N  Do you have problems with loss of bowel control? N  Managing your Medications? N  Managing your Finances? N  Housekeeping or managing your Housekeeping? N  Some recent data might be hidden    Immunizations and Health Maintenance Immunization History  Administered Date(s) Administered  . Influenza, High Dose Seasonal PF 09/16/2015  . Influenza,inj,Quad PF,36+ Mos 10/12/2013, 10/06/2014  . Pneumococcal Conjugate-13 05/20/2014  . Pneumococcal Polysaccharide-23 09/17/2006  . Td 08/09/2002  . Tdap 05/14/2013  . Zoster 08/09/2010   There are no preventive care reminders to display for this patient.  Patient Care Team: Tammi Sou, MD as PCP - General (Family Medicine) Alda Berthold, DO as Consulting Physician  (Neurology) Berle Mull, MD as Consulting Physician (Sports Medicine)  Indicate any recent Medical Services you may have received from other than [facility] providers in the past year (date may be approximate).    Assessment:   This is a routine wellness examination for C[place]on. Physical assessment deferred to PCP.   Hearing/Vision screen Hearing Screening Comments: Able to hear conversational tones w/o difficulty. No issues reported.   Vision Screening Comments: Wears glasses occasionally.    Dietary issues and exercise activities discussed: Current Exercise Habits: Home exercise routine (yard work), Type of exercise: strength training/weights;Other - see comments (stationary bike), Time (Minutes): 60, Frequency (Times/Week): 2, Weekly Exercise (Minutes/Week): 120, Exercise limited by: None identified   Diet (meal preparation, eat out, water intake, caffeinated beverages, dairy products, fruits and vegetables):  Drinks water, wine and milk.   Breakfast: bagel, cereal, honey, fruit Lunch: lean meat and vegetables, salad Dinner: pb crackers, sandwich     Encouraged to continue heart healthy diet and activity.   Goals      Patient Stated   . <enter goal here> (pt-stated)          Keep my exercising going.       Depression Screen PHQ 2/9 Scores 05/21/2016 05/20/2014 05/14/2013  PHQ - 2 Score 0 0 0    Fall Risk Fall Risk  05/21/2016 05/20/2014 05/14/2013  Falls in the past year? No Yes No  Number falls in past yr: - 1 -  Injury with Fall? - No -    Cognitive Function:       Ad8 score reviewed for issues:  Issues making decisions: no  Less interest in hobbies / activities: no  Repeats questions, stories (family complaining): no  Trouble using ordinary gadgets (microwave, computer, phone):  no  Forgets the month or year: no  Mismanaging finances: no  Remembering appts: no  Daily problems with thinking and/or memory: no Ad8 score is=0     Screening Tests Health  Maintenance  Topic Date Due  . INFLUENZA VACCINE  08/08/2016  . COLONOSCOPY  01/09/2019  . TETANUS/TDAP  05/15/2023  . PNA vac Low Risk Adult  Completed        Plan:    Make eye appointment.   Bring a copy of your advance directives to your next office visit.  Continue doing brain stimulating activities (puzzles, reading, adult coloring books, staying active) to keep memory sharp.   I have personally reviewed and noted the following in the patient's chart:   . Medical and social history . Use of alcohol, tobacco or illicit drugs  . Current medications and supplements . Functional ability and status . Nutritional status .  Physical activity . Advanced directives . List of other physicians . Hospitalizations, surgeries, and ER visits in previous 12 months . Vitals . Screenings to include cognitive, depression, and falls . Referrals and appointments  In addition, I have reviewed and discussed with patient certain preventive protocols, quality metrics, and best practice recommendations. A written personalized care plan for preventive services as well as general preventive health recommendations were provided to patient.     Gerilyn Nestle, RN   05/21/2016

## 2016-05-21 NOTE — Progress Notes (Signed)
Office Note 05/21/2016  CC:  Chief Complaint  Patient presents with  . Annual Exam    Pt is fasting.     HPI:  Paul Knight is a 76 y.o. male who is here for annual health maintenance exam.  Exercise: does some light wts in his home, pushes his wife's wheelchair a lot. Feels occasional R shoulder pain and L hip pain for the last 8 mo or so.  Not bad enough to take any meds. Eyes: scheduled exam. Dental: [facility] UTD.   Past Medical History:  Diagnosis Date  . 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 Erectile dysfunction   . Numbness in feet    ? Tarsal tunnel syndrome.  Ortho and neuro could not determine dx.      Past Surgical History:  Procedure Laterality Date  . APPENDECTOMY  1970s  . COLONOSCOPY  01/08/2009   Normal; recall 10 yrs  . TONSILLECTOMY  as a child    Family History  Problem Relation Age of Onset  . Heart failure Mother   . Heart attack Mother        Deceased, 23  . Heart disease Brother   . Other Father        Deceased, 44  . Healthy Son   . Thyroid disease Daughter     Social History   Social History  . Marital status: Married    Spouse name: N/A  . Number of children: 4  . Years of education: N/A   Occupational History  . Retired    Social History Main Topics  . Smoking status: Former Smoker    Packs/day: 0.25    Years: 10.00    Types: Cigarettes  . Smokeless tobacco: Never Used  . Alcohol use 0.6 - 1.2 oz/week    1 - 2 Glasses of wine per week     Comment: 2 glasses of wine nightly x 5 years  . Drug use: No  . Sexual activity: Not on file   Other Topics Concern  . Not on file   Social History Narrative   Married, 4 children in the  area.  Grandchildren x 7.   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   No tobacco.  Alcohol 2 glasses wine/day.   Exercise: [employer], walking in park, stationary bike at home: 3 days per week for 1 hour.    Outpatient Medications Prior to Visit  Medication Sig Dispense Refill  .  aspirin 81 MG tablet Take 81 mg by mouth daily.      . sildenafil (VIAGRA) 100 MG tablet Take 1 tablet (100 mg total) by mouth daily as needed. 10 tablet 3  . Omega-3 Fatty Acids (FISH OIL) 1000 MG CAPS Take 1,000 mg by mouth daily.     No facility-administered medications prior to visit.     No Known Allergies  ROS Review of Systems  Constitutional: Negative for appetite change, chills, fatigue and fever.  HENT: Negative for congestion, dental problem, ear pain and sore throat.   Eyes: Negative for discharge, redness and visual disturbance.  Respiratory: Negative for cough, chest tightness, shortness of breath and wheezing.   Cardiovascular: Negative for chest pain, palpitations and leg swelling.  Gastrointestinal: Negative for abdominal pain, blood in stool, diarrhea, nausea and vomiting.  Genitourinary: Negative for difficulty urinating, dysuria, flank pain, frequency, hematuria and urgency.  Musculoskeletal: Positive for arthralgias (8 mo of L shoulder pain and L hip pain--waxing and waning.). Negative for back pain, joint swelling, myalgias  and neck stiffness.  Skin: Negative for pallor and rash.  Neurological: Negative for dizziness, speech difficulty, weakness and headaches.  Hematological: Negative for adenopathy. Does not bruise/bleed easily.  Psychiatric/Behavioral: Negative for confusion and sleep disturbance. The patient is not nervous/anxious.     PE; Blood pressure 99/64, pulse 71, temperature 98 F (36.7 C), temperature source Oral, resp. rate 16, height 5' 9.5" (1.765 m), weight 193 lb 12 oz (87.9 kg), SpO2 98 %. Gen: Alert, well appearing.  Patient is oriented to person, place, time, and situation. AFFECT: pleasant, lucid thought and speech. ENT: Ears: EACs clear, normal epithelium.  TMs with good light reflex and landmarks bilaterally.  Eyes: no injection, icteris, swelling, or exudate.  EOMI, PERRLA. Nose: no drainage or turbinate edema/swelling.  No injection or  focal lesion.  Mouth: lips without lesion/swelling.  Oral mucosa pink and moist.  Dentition intact and without obvious caries or gingival swelling.  Oropharynx without erythema, exudate, or swelling.  Neck: supple/nontender.  No LAD, mass, or TM.  Carotid pulses 2+ bilaterally, without bruits. CV: RRR, no m/r/g.   LUNGS: CTA bilat, nonlabored resps, good aeration in all lung fields. ABD: soft, NT, ND, BS normal.  No hepatospenomegaly or mass.  No bruits. EXT: no clubbing, cyanosis, or edema.  Musculoskeletal: no joint swelling, erythema, warmth, or tenderness.  ROM of all joints intact (including R shoulder and L hip).  No sign of RC tendonitis or impingement in R shoulder.  No lateral hip tenderness on either side. Skin - no sores or rashes.  Many light brown macules, cherry angiomas, and seb ker. Right cheek with crusted pink papule.   Pertinent labs:  Lab Results  Component Value Date   TSH 1.20 05/20/2015   Lab Results  Component Value Date   WBC 7.5 05/20/2015   HGB 14.4 05/20/2015   HCT 42.9 05/20/2015   MCV 99.3 05/20/2015   PLT 309.0 05/20/2015   Lab Results  Component Value Date   CREATININE 0.90 05/20/2015   BUN 21 05/20/2015   NA 139 05/20/2015   K 5.1 05/20/2015   CL 106 05/20/2015   CO2 27 05/20/2015   Lab Results  Component Value Date   ALT 20 05/20/2015   AST 26 05/20/2015   ALKPHOS 31 (L) 05/20/2015   BILITOT 1.3 (H) 05/20/2015   Lab Results  Component Value Date   CHOL 143 05/20/2015   Lab Results  Component Value Date   HDL 53.30 05/20/2015   Lab Results  Component Value Date   LDLCALC 76 05/20/2015   Lab Results  Component Value Date   TRIG 69.0 05/20/2015   Lab Results  Component Value Date   CHOLHDL 3 05/20/2015   Lab Results  Component Value Date   PSA 0.44 02/27/2013   PSA 0.43 08/09/2010   PSA 0.39 08/05/2009   Lab Results  Component Value Date   VITAMINB12 452 07/16/2014    ASSESSMENT AND PLAN:   Health maintenance  exam: Reviewed age and gender appropriate health maintenance issues (prudent diet, regular exercise, health risks of tobacco and excessive alcohol, use of seatbelts, fire alarms in home, use of sunscreen).  Also reviewed age and gender appropriate health screening as well as vaccine recommendations. Vaccines: UTD. Labs: Fasting HP labs Prostate cancer screening: no further screening due to age. Colon cancer screening: recall 2021. Right cheek lesion, many pigmented skin lesions: refer to derm for eval of R cheek lesion and skin ca screening exam. Suspect R shoulder and L hip  pains are either mild muscle pain or mild osteoarthritis.  Encouraged tylenol 500-100mg  q6h prn if pain gets severe enough.  An After Visit Summary was printed and given to the patient.  FOLLOW UP:  Return in about 1 year (around 05/21/2017) for annual CPE (fasting).  Signed:  Crissie Sickles, MD           05/21/2016

## 2016-05-21 NOTE — Progress Notes (Signed)
AWV reviewed and agree.  Signed:  Phil McGowen, MD           05/21/2016  

## 2016-05-21 NOTE — Patient Instructions (Signed)
Make eye appointment.   Bring a copy of your advance directives to your next office visit.  Continue doing brain stimulating activities (puzzles, reading, adult coloring books, staying active) to keep memory sharp.    Health Maintenance, Male A healthy lifestyle and preventive care is important for your health and wellness. Ask your health care provider about what schedule of regular examinations is right for you. What should I know about weight and diet?  Eat a Healthy Diet  Eat plenty of vegetables, fruits, whole grains, low-fat dairy products, and lean protein.  Do not eat a lot of foods high in solid fats, added sugars, or salt. Maintain a Healthy Weight  Regular exercise can help you achieve or maintain a healthy weight. You should:  Do at least 150 minutes of exercise each week. The exercise should increase your heart rate and make you sweat (moderate-intensity exercise).  Do strength-training exercises at least twice a week. Watch Your Levels of Cholesterol and Blood Lipids  Have your blood tested for lipids and cholesterol every 5 years starting at 76 years of age. If you are at high risk for heart disease, you should start having your blood tested when you are 76 years old. You may need to have your cholesterol levels checked more often if:  Your lipid or cholesterol levels are high.  You are older than 76 years of age.  You are at high risk for heart disease. What should I know about cancer screening? Many types of cancers can be detected early and may often be prevented. Lung Cancer  You should be screened every year for lung cancer if:  You are a current smoker who has smoked for at least 30 years.  You are a former smoker who has quit within the past 15 years.  Talk to your health care provider about your screening options, when you should start screening, and how often you should be screened. Colorectal Cancer  Routine colorectal cancer screening usually  begins at 76 years of age and should be repeated every 5-10 years until you are 76 years old. You may need to be screened more often if early forms of precancerous polyps or small growths are found. Your health care provider may recommend screening at an earlier age if you have risk factors for colon cancer.  Your health care provider may recommend using home test kits to check for hidden blood in the stool.  A small camera at the end of a tube can be used to examine your colon (sigmoidoscopy or colonoscopy). This checks for the earliest forms of colorectal cancer. Prostate and Testicular Cancer  Depending on your age and overall health, your health care provider may do certain tests to screen for prostate and testicular cancer.  Talk to your health care provider about any symptoms or concerns you have about testicular or prostate cancer. Skin Cancer  Check your skin from head to toe regularly.  Tell your health care provider about any new moles or changes in moles, especially if:  There is a change in a mole's size, shape, or color.  You have a mole that is larger than a pencil eraser.  Always use sunscreen. Apply sunscreen liberally and repeat throughout the day.  Protect yourself by wearing long sleeves, pants, a wide-brimmed hat, and sunglasses when outside. What should I know about heart disease, diabetes, and high blood pressure?  If you are 7-60 years of age, have your blood pressure checked every 3-5 years. If you are  48 years of age or older, have your blood pressure checked every year. You should have your blood pressure measured twice-once when you are at a hospital or clinic, and once when you are not at a hospital or clinic. Record the average of the two measurements. To check your blood pressure when you are not at a hospital or clinic, you can use:  An automated blood pressure machine at a pharmacy.  A home blood pressure monitor.  Talk to your health care provider  about your target blood pressure.  If you are between 86-28 years old, ask your health care provider if you should take aspirin to prevent heart disease.  Have regular diabetes screenings by checking your fasting blood sugar level.  If you are at a normal weight and have a low risk for diabetes, have this test once every three years after the age of 72.  If you are overweight and have a high risk for diabetes, consider being tested at a younger age or more often.  A one-time screening for abdominal aortic aneurysm (AAA) by ultrasound is recommended for men aged 56-75 years who are current or former smokers. What should I know about preventing infection? Hepatitis B  If you have a higher risk for hepatitis B, you should be screened for this virus. Talk with your health care provider to find out if you are at risk for hepatitis B infection. Hepatitis C  Blood testing is recommended for:  Everyone born from [birth year] through 1965.  Anyone with known risk factors for hepatitis C. Sexually Transmitted Diseases (STDs)  You should be screened each year for STDs including gonorrhea and chlamydia if:  You are sexually active and are younger than 76 years of age.  You are older than 76 years of age and your health care provider tells you that you are at risk for this type of infection.  Your sexual activity has changed since you were last screened and you are at an increased risk for chlamydia or gonorrhea. Ask your health care provider if you are at risk.  Talk with your health care provider about whether you are at high risk of being infected with HIV. Your health care provider may recommend a prescription medicine to help prevent HIV infection. What else can I do?  Schedule regular health, dental, and eye exams.  Stay current with your vaccines (immunizations).  Do not use any tobacco products, such as cigarettes, chewing tobacco, and e-cigarettes. If you need help quitting, ask your health  care provider.  Limit alcohol intake to no more than 2 drinks per day. One drink equals 12 ounces of beer, 5 ounces of wine, or 1 ounces of hard liquor.  Do not use street drugs.  Do not share needles.  Ask your health care provider for help if you need support or information about quitting drugs.  Tell your health care provider if you often feel depressed.  Tell your health care provider if you have ever been abused or do not feel safe at home. This information is not intended to replace advice given to you by your health care provider. Make sure you discuss any questions you have with your health care provider. Document Released: 06/23/2007 Document Revised: 08/24/2015 Document Reviewed: 09/28/2014 Elsevier Interactive Patient Education  2017 [employer].

## 2016-05-22 ENCOUNTER — Encounter: Payer: Self-pay | Admitting: *Deleted

## 2016-05-24 ENCOUNTER — Ambulatory Visit (INDEPENDENT_AMBULATORY_CARE_PROVIDER_SITE_OTHER): Payer: Medicare HMO | Admitting: Family Medicine

## 2016-05-24 ENCOUNTER — Encounter: Payer: Self-pay | Admitting: Family Medicine

## 2016-05-24 VITALS — BP 138/80 | HR 73 | Temp 98.1°F | Resp 16 | Ht 69.5 in | Wt 193.0 lb

## 2016-05-24 DIAGNOSIS — L300 Nummular dermatitis: Secondary | ICD-10-CM | POA: Diagnosis not present

## 2016-05-24 MED ORDER — CEPHALEXIN 500 MG PO CAPS: 500.0000 mg | ORAL_CAPSULE | Freq: Three times a day (TID) | ORAL | 0 refills | Status: DC

## 2016-05-24 MED ORDER — FLUTICASONE PROPIONATE 0.05 % EX CREA: TOPICAL_CREAM | CUTANEOUS | 1 refills | Status: DC

## 2016-05-24 NOTE — Progress Notes (Addendum)
OFFICE VISIT  05/24/2016   CC:  Chief Complaint  Patient presents with  . Insect Bite    3 weeks ago, arms and legs   HPI:    Patient is a 76 y.o. Caucasian male who presents for "insect bites". He was working on trees, cutting limbs about 1 mo ago.  Noted red splotches about the size of a quarter that itched and burned after he was finished.  He never saw anything bite him.  They don't improve with hydrocortisone.  Neosporin made them worse.  They have gradually been growing in size.  He has never had a rxn to anything in the past.  No contact with poison ivy/oak. These are peeling some superficially.  Sweating and heat make them itchy now, otherwise they seem to cause minimal sx's. No fevers/chills, no malaise.  No joint aches or swelling.  Past Medical History:  Diagnosis Date  . 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 Erectile dysfunction   . Numbness in feet    ? Tarsal tunnel syndrome.  Ortho and neuro could not determine dx.      Past Surgical History:  Procedure Laterality Date  . APPENDECTOMY  1970s  . COLONOSCOPY  01/08/2009   Normal; recall 10 yrs  . TONSILLECTOMY  as a child    Outpatient Medications Prior to Visit  Medication Sig Dispense Refill  . aspirin 81 MG tablet Take 81 mg by mouth daily.      . Multiple Vitamin (MULTIVITAMIN) tablet Take 1 tablet by mouth daily.    . sildenafil (VIAGRA) 100 MG tablet Take 1 tablet (100 mg total) by mouth daily as needed. 10 tablet 3   No facility-administered medications prior to visit.     No Known Allergies  ROS As per HPI  PE: Blood pressure 138/80, pulse 73, temperature 98.1 F (36.7 C), temperature source Oral, resp. rate 16, height 5' 9.5" (1.765 m), weight 193 lb (87.5 kg), SpO2 97 %. Gen: Alert, well appearing.  Patient is oriented to person, place, time, and situation. AFFECT: pleasant, lucid thought and speech. SKIN: entire R antecubital fossa with erythematous macular rash with superficial desquamation w/out  maceration.  No warmth or tenderness.  This lesion has poorly demarcated borders compared to the other lesions and it is irregular shaped as opposed to the other lesions, which are nearly perfectly round. One lesion located on left forearm about nickel sized, round, erythematous macular rash with superficial desquamation.  Nontender.  No streaking.  Two similar lesions on back of L leg, about 3-5 cm in diameter.  No tenderness or streaking.  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  I think it is coincidental that this started after he was cutting tree limbs. I don't think this is contact derm or insect bite rxn. The right antecubital fossa lesion looks like it may have started to get a bit of bacterial superinfection. Will rx cutivate 0.05% cream, apply bid  to affected areas and occlude with vaseline. Keflex 500 mg tid x 7d.  (DDX: epidermal dermatophytosis, contact derm, psoriasis vulgaris, early stages of mycosis fungoides, impetigo, familial pemphigus).  An After Visit Summary was printed and given to the patient.  FOLLOW UP: Return if symptoms worsen or fail to improve in 10d.  Signed:  Crissie Sickles, MD           05/24/2016

## 2016-06-11 DIAGNOSIS — C4492 Squamous cell carcinoma of skin, unspecified: Secondary | ICD-10-CM

## 2016-06-11 DIAGNOSIS — L821 Other seborrheic keratosis: Secondary | ICD-10-CM | POA: Diagnosis not present

## 2016-06-11 DIAGNOSIS — L820 Inflamed seborrheic keratosis: Secondary | ICD-10-CM | POA: Diagnosis not present

## 2016-06-11 DIAGNOSIS — L570 Actinic keratosis: Secondary | ICD-10-CM | POA: Diagnosis not present

## 2016-06-11 DIAGNOSIS — D229 Melanocytic nevi, unspecified: Secondary | ICD-10-CM | POA: Diagnosis not present

## 2016-06-11 DIAGNOSIS — D492 Neoplasm of unspecified behavior of bone, soft tissue, and skin: Secondary | ICD-10-CM | POA: Diagnosis not present

## 2016-06-11 DIAGNOSIS — D0439 Carcinoma in situ of skin of other parts of face: Secondary | ICD-10-CM | POA: Diagnosis not present

## 2016-06-11 HISTORY — DX: Squamous cell carcinoma of skin, unspecified: C44.92

## 2016-07-05 DIAGNOSIS — D0439 Carcinoma in situ of skin of other parts of face: Secondary | ICD-10-CM | POA: Diagnosis not present

## 2016-10-04 ENCOUNTER — Ambulatory Visit (INDEPENDENT_AMBULATORY_CARE_PROVIDER_SITE_OTHER): Payer: Medicare HMO

## 2016-10-04 DIAGNOSIS — Z23 Encounter for immunization: Secondary | ICD-10-CM

## 2016-10-12 DIAGNOSIS — H52223 Regular astigmatism, bilateral: Secondary | ICD-10-CM | POA: Diagnosis not present

## 2016-10-12 DIAGNOSIS — H5203 Hypermetropia, bilateral: Secondary | ICD-10-CM | POA: Diagnosis not present

## 2016-10-12 DIAGNOSIS — H524 Presbyopia: Secondary | ICD-10-CM | POA: Diagnosis not present

## 2016-10-12 DIAGNOSIS — H2513 Age-related nuclear cataract, bilateral: Secondary | ICD-10-CM | POA: Diagnosis not present

## 2017-04-22 ENCOUNTER — Telehealth: Payer: Self-pay | Admitting: Family Medicine

## 2017-04-22 DIAGNOSIS — R21 Rash and other nonspecific skin eruption: Secondary | ICD-10-CM

## 2017-04-22 NOTE — Telephone Encounter (Signed)
OK, derm referral ordered as per pt request.

## 2017-04-22 NOTE — Telephone Encounter (Signed)
Left detailed message on home vm, okay per DPR.  

## 2017-04-22 NOTE — Telephone Encounter (Signed)
Summary: referral [facility]  Reason for CRM: would like like to know if Dr Anitra Lauth could put a referral in to go see:    DR Denna Haggard   Address: [address] [place], [place] [address]  Phone: [phone]  For the spot on his face back in June of 2018? Dermatology

## 2017-04-22 NOTE — Telephone Encounter (Signed)
Please advise. Thanks.  

## 2017-05-24 ENCOUNTER — Encounter: Payer: Medicare HMO | Admitting: Family Medicine

## 2017-05-27 NOTE — Progress Notes (Signed)
Subjective:   Paul Knight is a 77 y.o. male who presents for Medicare Annual/Subsequent preventive examination.  Review of Systems:  No ROS.  Medicare Wellness Visit. Additional risk factors are reflected in the social history.  Cardiac Risk Factors include: advanced age (>40men, >88 women);male gender;family history of premature cardiovascular disease   Sleep patterns: Sleeps 5-6 hours, naps in afternoon.  Home Safety/Smoke Alarms: Feels safe in home. Smoke alarms in place.  Living environment; residence and Firearm Safety: Lives with wife in 2 story home, uses rail. Seat Belt Safety/Bike Helmet: Wears seat belt.    Male:   CCS-Colonoscopy 01/08/2009, normal.       PSA-  Lab Results  Component Value Date   PSA 0.44 02/27/2013   PSA 0.43 08/09/2010   PSA 0.39 08/05/2009       Objective:    Vitals: BP 118/70 (BP Location: Left Arm, Patient Position: Sitting, Cuff Size: Normal)   Pulse 73   Temp (!) 97.3 F (36.3 C) (Oral)   Resp 16   Ht 5' 9.5" (1.765 m)   Wt 193 lb 6 oz (87.7 kg)   SpO2 98%   BMI 28.15 kg/m   Body mass index is 28.15 kg/m.  Advanced Directives 05/28/2017 05/21/2016  Does Patient Have a Medical Advance Directive? Yes Yes  Type of Advance Directive Healthcare Power of [place] in Chart? - No - copy requested  Would patient like information on creating a medical advance directive? Yes (MAU/Ambulatory/Procedural Areas - Information given) -    Tobacco Social History   Tobacco Use  Smoking Status Former Smoker  . Packs/day: 0.25  . Years: 10.00  . Pack years: 2.50  . Types: Cigarettes  Smokeless Tobacco Never Used     Counseling given: Not Answered   Past Medical History:  Diagnosis Date  . 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 Erectile dysfunction   . Numbness in feet    ? Tarsal tunnel syndrome.  Ortho and neuro could not determine dx.     Past Surgical History:  Procedure  Laterality Date  . APPENDECTOMY  1970s  . COLONOSCOPY  01/08/2009   Normal; recall 10 yrs  . TONSILLECTOMY  as a child   Family History  Problem Relation Age of Onset  . Heart failure Mother   . Heart attack Mother        Deceased, 40  . Other Father        Deceased, 93  . Heart disease Brother   . Healthy Son   . Thyroid disease Daughter   . Cancer Daughter        ovarian   Social History   Socioeconomic History  . Marital status: Married    Spouse name: Not on file  . Number of children: 4  . Years of education: Not on file  . Highest education level: Not on file  Occupational History  . Occupation: Retired  Scientific laboratory technician  . Financial resource strain: Not on file  . Food insecurity:    Worry: Not on file    Inability: Not on file  . Transportation needs:    Medical: Not on file    Non-medical: Not on file  Tobacco Use  . Smoking status: Former Smoker    Packs/day: 0.25    Years: 10.00    Pack years: 2.50    Types: Cigarettes  . Smokeless tobacco: Never Used  Substance and Sexual  Activity  . Alcohol use: Yes    Alcohol/week: 0.6 - 1.2 oz    Types: 1 - 2 Glasses of wine per week    Comment: 2 glasses of wine nightly x 5 years  . Drug use: No  . Sexual activity: Not on file  Lifestyle  . Physical activity:    Days per week: Not on file    Minutes per session: Not on file  . Stress: Not on file  Relationships  . Social connections:    Talks on phone: Not on file    Gets together: Not on file    Attends religious service: Not on file    Active member of club or organization: Not on file    Attends meetings of clubs or organizations: Not on file    Relationship status: Not on file  Other Topics Concern  . Not on file  Social History Narrative   Married, 4 children in the [place] area.  Grandchildren x 7.   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   No tobacco.  Alcohol 2 glasses wine/day.   Exercise: [employer], walking in park, stationary bike at home:  3 days per week for 1 hour.    Outpatient Encounter Medications as of 05/28/2017  Medication Sig  . fluticasone (CUTIVATE) 0.05 % cream Apply twice daily to the affected areas and occlude with vaseline  . sildenafil (VIAGRA) 100 MG tablet Take 1 tablet (100 mg total) by mouth daily as needed.  Marland Kitchen aspirin 81 MG tablet Take 81 mg by mouth daily.    . Multiple Vitamin (MULTIVITAMIN) tablet Take 1 tablet by mouth daily.  . [DISCONTINUED] cephALEXin (KEFLEX) 500 MG capsule Take 1 capsule (500 mg total) by mouth 3 (three) times daily.   No facility-administered encounter medications on file as of 05/28/2017.     Activities of Daily Living In your present state of health, do you have any difficulty performing the following activities: 05/28/2017  Hearing? N  Vision? N  Difficulty concentrating or making decisions? N  Walking or climbing stairs? N  Dressing or bathing? N  Doing errands, shopping? N  Preparing Food and eating ? N  Using the Toilet? N  In the past six months, have you accidently leaked urine? N  Do you have problems with loss of bowel control? N  Managing your Medications? N  Managing your Finances? N  Housekeeping or managing your Housekeeping? N  Some recent data might be hidden    Patient Care Team: Tammi Sou, MD as PCP - General (Family Medicine) Alda Berthold, DO as Consulting Physician (Neurology) Berle Mull, MD as Consulting Physician (Sports Medicine) Lavonna Monarch, MD as Consulting Physician (Dermatology)   Assessment:   This is a routine wellness examination for [name].  Exercise Activities and Dietary recommendations Current Exercise Habits: Home exercise routine, Type of exercise: strength training/weights;Other - see comments(stationary bike), Time (Minutes): 60, Frequency (Times/Week): 2, Weekly Exercise (Minutes/Week): 120, Exercise limited by: None identified   Diet (meal preparation, eat out, water intake, caffeinated beverages, dairy  products, fruits and vegetables): Drinks water and wine.   Breakfast: fruit and cereal/bagel/oatmeal; cheese toast; coffee Lunch: Outs out 5 days/week. Sandwich/soup/salad Dinner: sandwich; crackers    Goals    . Patient Stated     Maintain current health by staying active.        Fall Risk Fall Risk  05/28/2017 05/21/2016 05/20/2014 05/14/2013  Falls in the past year? Yes No Yes No  Number falls  in past yr: 2 or more - 1 -  Injury with Fall? No - No -  Risk Factor Category  High Fall Risk - - -  Risk for fall due to : Other (Comment) - - -  Risk for fall due to: Comment weak right ankle d/t stretched tendon - - -  Follow up Falls prevention discussed - - -    Depression Screen PHQ 2/9 Scores 05/28/2017 05/21/2016 05/20/2014 05/14/2013  PHQ - 2 Score 0 0 0 0    Cognitive Function MMSE - Mini Mental State Exam 05/28/2017  Orientation to time 5  Orientation to Place 5  Registration 3  Attention/ Calculation 5  Recall 2  Language- name 2 objects 2  Language- repeat 1  Language- follow 3 step command 3  Language- read & follow direction 1  Write a sentence 1  Copy design 1  Total score 29        Immunization History  Administered Date(s) Administered  . Influenza, High Dose Seasonal PF 09/16/2015, 10/04/2016  . Influenza,inj,Quad PF,6+ Mos 10/12/2013, 10/06/2014  . Pneumococcal Conjugate-13 05/20/2014  . Pneumococcal Polysaccharide-23 09/17/2006  . Td 08/09/2002  . Tdap 05/14/2013  . Zoster 08/09/2010    Screening Tests Health Maintenance  Topic Date Due  . INFLUENZA VACCINE  08/08/2017  . TETANUS/TDAP  05/15/2023  . PNA vac Low Risk Adult  Completed        Plan:    Continue doing brain stimulating activities (puzzles, reading, adult coloring books, staying active) to keep memory sharp.   Bring a copy of your living will and/or healthcare power of attorney to your next office visit.  Continue to eat heart healthy diet (full of fruits, vegetables, whole  grains, lean protein, water--limit salt, fat, and sugar intake) and increase physical activity as tolerated.  I have personally reviewed and noted the following in the patient's chart:   . Medical and social history . Use of alcohol, tobacco or illicit drugs  . Current medications and supplements . Functional ability and status . Nutritional status . Physical activity . Advanced directives . List of other physicians . Hospitalizations, surgeries, and ER visits in previous 12 months . Vitals . Screenings to include cognitive, depression, and falls . Referrals and appointments  In addition, I have reviewed and discussed with patient certain preventive protocols, quality metrics, and best practice recommendations. A written personalized care plan for preventive services as well as general preventive health recommendations were provided to patient.     Gerilyn Nestle, RN  05/28/2017

## 2017-05-28 ENCOUNTER — Ambulatory Visit (INDEPENDENT_AMBULATORY_CARE_PROVIDER_SITE_OTHER): Payer: Medicare HMO | Admitting: Family Medicine

## 2017-05-28 ENCOUNTER — Ambulatory Visit (INDEPENDENT_AMBULATORY_CARE_PROVIDER_SITE_OTHER): Payer: Medicare HMO

## 2017-05-28 ENCOUNTER — Other Ambulatory Visit: Payer: Self-pay

## 2017-05-28 ENCOUNTER — Encounter: Payer: Self-pay | Admitting: Family Medicine

## 2017-05-28 VITALS — BP 118/70 | HR 73 | Temp 97.3°F | Resp 16 | Ht 69.5 in | Wt 193.4 lb

## 2017-05-28 DIAGNOSIS — Z0000 Encounter for general adult medical examination without abnormal findings: Secondary | ICD-10-CM

## 2017-05-28 DIAGNOSIS — E663 Overweight: Secondary | ICD-10-CM | POA: Diagnosis not present

## 2017-05-28 LAB — CBC WITH DIFFERENTIAL/PLATELET
BASOS PCT: 1.2 % (ref 0.0–3.0)
Basophils Absolute: 0.1 10*3/uL (ref 0.0–0.1)
EOS ABS: 0.1 10*3/uL (ref 0.0–0.7)
EOS PCT: 1.3 % (ref 0.0–5.0)
HEMATOCRIT: 42.6 % (ref 39.0–52.0)
HEMOGLOBIN: 14.2 g/dL (ref 13.0–17.0)
LYMPHS PCT: 37.8 % (ref 12.0–46.0)
Lymphs Abs: 2.4 10*3/uL (ref 0.7–4.0)
MCHC: 33.3 g/dL (ref 30.0–36.0)
MCV: 101.2 fl — AB (ref 78.0–100.0)
Monocytes Absolute: 0.8 10*3/uL (ref 0.1–1.0)
Monocytes Relative: 12.9 % — ABNORMAL HIGH (ref 3.0–12.0)
NEUTROS ABS: 3 10*3/uL (ref 1.4–7.7)
Neutrophils Relative %: 46.8 % (ref 43.0–77.0)
PLATELETS: 296 10*3/uL (ref 150.0–400.0)
RBC: 4.21 Mil/uL — ABNORMAL LOW (ref 4.22–5.81)
RDW: 14.1 % (ref 11.5–15.5)
WBC: 6.3 10*3/uL (ref 4.0–10.5)

## 2017-05-28 LAB — COMPREHENSIVE METABOLIC PANEL
ALBUMIN: 3.7 g/dL (ref 3.5–5.2)
ALK PHOS: 30 U/L — AB (ref 39–117)
ALT: 25 U/L (ref 0–53)
AST: 29 U/L (ref 0–37)
BUN: 24 mg/dL — ABNORMAL HIGH (ref 6–23)
CO2: 25 mEq/L (ref 19–32)
Calcium: 8.8 mg/dL (ref 8.4–10.5)
Chloride: 106 mEq/L (ref 96–112)
Creatinine, Ser: 0.9 mg/dL (ref 0.40–1.50)
GFR: 86.97 mL/min (ref >60.00)
Glucose, Bld: 100 mg/dL — ABNORMAL HIGH (ref 70–99)
POTASSIUM: 4.1 meq/L (ref 3.5–5.1)
Sodium: 139 mEq/L (ref 135–145)
Total Bilirubin: 1 mg/dL (ref 0.2–1.2)
Total Protein: 6.7 g/dL (ref 6.0–8.3)

## 2017-05-28 LAB — LIPID PANEL
Cholesterol: 106 mg/dL (ref 0–200)
HDL: 57.1 mg/dL (ref >39.00)
LDL Cholesterol: 39 mg/dL (ref 0–99)
NONHDL: 48.84
Total CHOL/HDL Ratio: 2
Triglycerides: 51 mg/dL (ref 0.0–149.0)
VLDL: 10.2 mg/dL (ref 0.0–40.0)

## 2017-05-28 NOTE — Patient Instructions (Signed)
Do not take aspirin 

## 2017-05-28 NOTE — Progress Notes (Signed)
AWV reviewed and agree. Signed:  Phil McGowen, MD           05/28/2017  

## 2017-05-28 NOTE — Patient Instructions (Addendum)
Continue doing brain stimulating activities (puzzles, reading, adult coloring books, staying active) to keep memory sharp.   Bring a copy of your living will and/or healthcare power of attorney to your next office visit.  Continue to eat heart healthy diet (full of fruits, vegetables, whole grains, lean protein, water--limit salt, fat, and sugar intake) and increase physical activity as tolerated.    Health Maintenance, Male A healthy lifestyle and preventive care is important for your health and wellness. Ask your health care provider about what schedule of regular examinations is right for you. What should I know about weight and diet? Eat a Healthy Diet  Eat plenty of vegetables, fruits, whole grains, low-fat dairy products, and lean protein.  Do not eat a lot of foods high in solid fats, added sugars, or salt.  Maintain a Healthy Weight Regular exercise can help you achieve or maintain a healthy weight. You should:  Do at least 150 minutes of exercise each week. The exercise should increase your heart rate and make you sweat (moderate-intensity exercise).  Do strength-training exercises at least twice a week.  Watch Your Levels of Cholesterol and Blood Lipids  Have your blood tested for lipids and cholesterol every 5 years starting at 77 years of age. If you are at high risk for heart disease, you should start having your blood tested when you are 77 years old. You may need to have your cholesterol levels checked more often if: ? Your lipid or cholesterol levels are high. ? You are older than 77 years of age. ? You are at high risk for heart disease.  What should I know about cancer screening? Many types of cancers can be detected early and may often be prevented. Lung Cancer  You should be screened every year for lung cancer if: ? You are a current smoker who has smoked for at least 30 years. ? You are a former smoker who has quit within the past 15 years.  Talk to your  health care provider about your screening options, when you should start screening, and how often you should be screened.  Colorectal Cancer  Routine colorectal cancer screening usually begins at 77 years of age and should be repeated every 5-10 years until you are 77 years old. You may need to be screened more often if early forms of precancerous polyps or small growths are found. Your health care provider may recommend screening at an earlier age if you have risk factors for colon cancer.  Your health care provider may recommend using home test kits to check for hidden blood in the stool.  A small camera at the end of a tube can be used to examine your colon (sigmoidoscopy or colonoscopy). This checks for the earliest forms of colorectal cancer.  Prostate and Testicular Cancer  Depending on your age and overall health, your health care provider may do certain tests to screen for prostate and testicular cancer.  Talk to your health care provider about any symptoms or concerns you have about testicular or prostate cancer.  Skin Cancer  Check your skin from head to toe regularly.  Tell your health care provider about any new moles or changes in moles, especially if: ? There is a change in a mole's size, shape, or color. ? You have a mole that is larger than a pencil eraser.  Always use sunscreen. Apply sunscreen liberally and repeat throughout the day.  Protect yourself by wearing long sleeves, pants, a wide-brimmed hat, and sunglasses when  outside.  What should I know about heart disease, diabetes, and high blood pressure?  If you are 87-59 years of age, have your blood pressure checked every 3-5 years. If you are 52 years of age or older, have your blood pressure checked every year. You should have your blood pressure measured twice-once when you are at a hospital or clinic, and once when you are not at a hospital or clinic. Record the average of the two measurements. To check your  blood pressure when you are not at a hospital or clinic, you can use: ? An automated blood pressure machine at a pharmacy. ? A home blood pressure monitor.  Talk to your health care provider about your target blood pressure.  If you are between 83-64 years old, ask your health care provider if you should take aspirin to prevent heart disease.  Have regular diabetes screenings by checking your fasting blood sugar level. ? If you are at a normal weight and have a low risk for diabetes, have this test once every three years after the age of 69. ? If you are overweight and have a high risk for diabetes, consider being tested at a younger age or more often.  A one-time screening for abdominal aortic aneurysm (AAA) by ultrasound is recommended for men aged 46-75 years who are current or former smokers. What should I know about preventing infection? Hepatitis B If you have a higher risk for hepatitis B, you should be screened for this virus. Talk with your health care provider to find out if you are at risk for hepatitis B infection. Hepatitis C Blood testing is recommended for:  Everyone born from [birth year] through 1965.  Anyone with known risk factors for hepatitis C.  Sexually Transmitted Diseases (STDs)  You should be screened each year for STDs including gonorrhea and chlamydia if: ? You are sexually active and are younger than 77 years of age. ? You are older than 77 years of age and your health care provider tells you that you are at risk for this type of infection. ? Your sexual activity has changed since you were last screened and you are at an increased risk for chlamydia or gonorrhea. Ask your health care provider if you are at risk.  Talk with your health care provider about whether you are at high risk of being infected with HIV. Your health care provider may recommend a prescription medicine to help prevent HIV infection.  What else can I do?  Schedule regular health, dental, and  eye exams.  Stay current with your vaccines (immunizations).  Do not use any tobacco products, such as cigarettes, chewing tobacco, and e-cigarettes. If you need help quitting, ask your health care provider.  Limit alcohol intake to no more than 2 drinks per day. One drink equals 12 ounces of beer, 5 ounces of wine, or 1 ounces of hard liquor.  Do not use street drugs.  Do not share needles.  Ask your health care provider for help if you need support or information about quitting drugs.  Tell your health care provider if you often feel depressed.  Tell your health care provider if you have ever been abused or do not feel safe at home. This information is not intended to replace advice given to you by your health care provider. Make sure you discuss any questions you have with your health care provider. Document Released: 06/23/2007 Document Revised: 08/24/2015 Document Reviewed: 09/28/2014 Elsevier Interactive Patient Education  [employer].

## 2017-05-28 NOTE — Progress Notes (Signed)
Office Note 05/28/2017  CC:  Chief Complaint  Patient presents with  . Annual Exam    pt is fasting    HPI:  Paul Knight is a 77 y.o. White male who is here for annual health maintenance exam.  Has some R shoulder pains: shooting pain with certain movements only. Left hip pain lateral aspect, some pain when lying on this hip in bed.  During the day, no signif problem during the day. Occ takes tylenol for the pain.  Right ankle pain, instability, hx of ortho MD eval and was given a lace up ankle support to wear when he knows he'll be on uneven ground.    GERD/dyspepsia some in the last year that got better when he stopped his daily ASA---he has not resumed taking the ASA.  EXERCISE: stationary bike and light wt lifting for toning.   Past Medical History:  Diagnosis Date  . 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 Erectile dysfunction   . Numbness in feet    ? Tarsal tunnel syndrome.  Ortho and neuro could not determine dx.      Past Surgical History:  Procedure Laterality Date  . APPENDECTOMY  1970s  . COLONOSCOPY  01/08/2009   Normal; recall 10 yrs  . TONSILLECTOMY  as a child    Family History  Problem Relation Age of Onset  . Heart failure Mother   . Heart attack Mother        Deceased, 70  . Other Father        Deceased, 51  . Heart disease Brother   . Healthy Son   . Thyroid disease Daughter   . Cancer Daughter        ovarian    Social History   Socioeconomic History  . Marital status: Married    Spouse name: Not on file  . Number of children: 4  . Years of education: Not on file  . Highest education level: Not on file  Occupational History  . Occupation: Retired  Scientific laboratory technician  . Financial resource strain: Not on file  . Food insecurity:    Worry: Not on file    Inability: Not on file  . Transportation needs:    Medical: Not on file    Non-medical: Not on file  Tobacco Use  . Smoking status: Former Smoker    Packs/day: 0.25    Years: 10.00    Pack  years: 2.50    Types: Cigarettes  . Smokeless tobacco: Never Used  Substance and Sexual Activity  . Alcohol use: Yes    Alcohol/week: 0.6 - 1.2 oz    Types: 1 - 2 Glasses of wine per week    Comment: 2 glasses of wine nightly x 5 years  . Drug use: No  . Sexual activity: Not on file  Lifestyle  . Physical activity:    Days per week: Not on file    Minutes per session: Not on file  . Stress: Not on file  Relationships  . Social connections:    Talks on phone: Not on file    Gets together: Not on file    Attends religious service: Not on file    Active member of club or organization: Not on file    Attends meetings of clubs or organizations: Not on file    Relationship status: Not on file  . Intimate partner violence:    Fear of current or ex partner: Not on file    Emotionally  abused: Not on file    Physically abused: Not on file    Forced sexual activity: Not on file  Other Topics Concern  . Not on file  Social History Narrative   Married, 4 children in the [place] area.  Grandchildren x 7.   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   No tobacco.  Alcohol 2 glasses wine/day.   Exercise: [employer], walking in park, stationary bike at home: 3 days per week for 1 hour.    Outpatient Medications Prior to Visit  Medication Sig Dispense Refill  . fluticasone (CUTIVATE) 0.05 % cream Apply twice daily to the affected areas and occlude with vaseline 30 g 1  . Multiple Vitamin (MULTIVITAMIN) tablet Take 1 tablet by mouth daily.    . sildenafil (VIAGRA) 100 MG tablet Take 1 tablet (100 mg total) by mouth daily as needed. 10 tablet 3  . aspirin 81 MG tablet Take 81 mg by mouth daily.       No facility-administered medications prior to visit.     No Known Allergies  ROS Review of Systems  Constitutional: Negative for appetite change, chills, fatigue and fever.  HENT: Negative for congestion, dental problem, ear pain and sore throat.   Eyes: Negative for discharge, redness  and visual disturbance.  Respiratory: Negative for cough, chest tightness, shortness of breath and wheezing.   Cardiovascular: Negative for chest pain, palpitations and leg swelling.  Gastrointestinal: Negative for abdominal pain, blood in stool, diarrhea, nausea and vomiting.  Genitourinary: Negative for difficulty urinating, dysuria, flank pain, frequency, hematuria and urgency.  Musculoskeletal: Negative for arthralgias, back pain, joint swelling, myalgias and neck stiffness.  Skin: Negative for pallor and rash.  Neurological: Negative for dizziness, speech difficulty, weakness and headaches.  Hematological: Negative for adenopathy. Does not bruise/bleed easily.  Psychiatric/Behavioral: Negative for confusion and sleep disturbance. The patient is not nervous/anxious.     PE; Blood pressure 118/70, pulse 73, temperature (!) 97.3 F (36.3 C), temperature source Oral, resp. rate 16, height 5' 9.5" (1.765 m), weight 193 lb 6 oz (87.7 kg), SpO2 98 %. Gen: Alert, well appearing.  Patient is oriented to person, place, time, and situation. AFFECT: pleasant, lucid thought and speech. ENT: Ears: EACs clear, normal epithelium.  TMs with good light reflex and landmarks bilaterally.  Eyes: no injection, icteris, swelling, or exudate.  EOMI, PERRLA. Nose: no drainage or turbinate edema/swelling.  No injection or focal lesion.  Mouth: lips without lesion/swelling.  Oral mucosa pink and moist.  Dentition intact and without obvious caries or gingival swelling.  Oropharynx without erythema, exudate, or swelling.  Neck: supple/nontender.  No LAD, mass, or TM.  Carotid pulses 2+ bilaterally, without bruits. CV: RRR, no m/r/g.   LUNGS: CTA bilat, nonlabored resps, good aeration in all lung fields. ABD: soft, NT, ND, BS normal.  No hepatospenomegaly or mass.  No bruits. EXT: no clubbing, cyanosis, or edema.  Musculoskeletal: no joint swelling, erythema, warmth, or tenderness.  ROM of all joints intact.  He has  mild TTP of L greater troch region.  Left ankle with excess laxity on talar tilt and anterior drawer testing.  No swelling, tenderness, or erythema. Skin - no sores or suspicious lesions or rashes or color changes Neuro: CN 2-12 intact bilaterally, strength 5/5 in proximal and distal upper extremities and lower extremities bilaterally.  No sensory deficits.  No tremor.  FNF normal bilat.  No ataxia.  Upper extremity and lower extremity DTRs symmetric.  No pronator drift.  Pertinent labs:  Lab Results  Component Value Date   TSH 1.20 05/20/2015   Lab Results  Component Value Date   WBC 7.3 05/21/2016   HGB 14.5 05/21/2016   HCT 43.3 05/21/2016   MCV 101.3 (H) 05/21/2016   PLT 308.0 05/21/2016   Lab Results  Component Value Date   CREATININE 1.00 05/21/2016   BUN 25 (H) 05/21/2016   NA 137 05/21/2016   K 4.2 05/21/2016   CL 105 05/21/2016   CO2 25 05/21/2016   Lab Results  Component Value Date   ALT 22 05/21/2016   AST 28 05/21/2016   ALKPHOS 29 (L) 05/21/2016   BILITOT 1.2 05/21/2016   Lab Results  Component Value Date   CHOL 126 05/21/2016   Lab Results  Component Value Date   HDL 55.30 05/21/2016   Lab Results  Component Value Date   LDLCALC 55 05/21/2016   Lab Results  Component Value Date   TRIG 78.0 05/21/2016   Lab Results  Component Value Date   CHOLHDL 2 05/21/2016   Lab Results  Component Value Date   PSA 0.44 02/27/2013   PSA 0.43 08/09/2010   PSA 0.39 08/05/2009    ASSESSMENT AND PLAN:   Health maintenance exam: Reviewed age and gender appropriate health maintenance issues (prudent diet, regular exercise, health risks of tobacco and excessive alcohol, use of seatbelts, fire alarms in home, use of sunscreen).  Also reviewed age and gender appropriate health screening as well as vaccine recommendations. Vaccines: UTD.  Shingrix discussed-- pt will consider getting this. Labs: CBC, CMET, FLP. Prostate ca screening: no further screening due to  age. Colon ca screening: next colonoscopy due 2021.  Due to recent research indicating lack of adequate evidence to support use of ASA for primary prevention, I have advised pt to remain off of his daily ASA 81mg , esp since this seems to have been associated with some dyspepsia/GERD for him.  Mild musculoskeletal pains: R shoulder, L hip (suspect troch bursitis)--he can continue prn use of naproxen (pt says he rarely has to take this med).  Encouraged use of tylenol as first line agent, though. An After Visit Summary was printed and given to the patient.  FOLLOW UP:  Return in about 1 year (around 05/29/2018) for annual CPE (fasting).  Signed:  Crissie Sickles, MD           05/28/2017

## 2017-10-21 ENCOUNTER — Telehealth: Payer: Self-pay | Admitting: Family Medicine

## 2017-10-21 ENCOUNTER — Ambulatory Visit (INDEPENDENT_AMBULATORY_CARE_PROVIDER_SITE_OTHER): Payer: Medicare HMO | Admitting: Family Medicine

## 2017-10-21 DIAGNOSIS — Z23 Encounter for immunization: Secondary | ICD-10-CM | POA: Diagnosis not present

## 2017-10-21 MED ORDER — ZOSTER VAC RECOMB ADJUVANTED 50 MCG/0.5ML IM SUSR: 0.5000 mL | Freq: Once | INTRAMUSCULAR | 1 refills | Status: AC

## 2017-10-21 NOTE — Telephone Encounter (Signed)
Rx sent to pharmacy requested.

## 2017-10-21 NOTE — Telephone Encounter (Signed)
OK to send shingrix rx to pt's pharmacy.-thx

## 2017-10-21 NOTE — Telephone Encounter (Signed)
Please send Rx for shringrix to [facility]. Thank you

## 2017-12-09 ENCOUNTER — Telehealth: Payer: Self-pay | Admitting: *Deleted

## 2017-12-09 ENCOUNTER — Other Ambulatory Visit: Payer: Self-pay | Admitting: Family Medicine

## 2017-12-09 DIAGNOSIS — G8929 Other chronic pain: Secondary | ICD-10-CM

## 2017-12-09 DIAGNOSIS — M25511 Pain in right shoulder: Principal | ICD-10-CM

## 2017-12-09 NOTE — Telephone Encounter (Signed)
OK, referral ordered as per pt request. 

## 2017-12-09 NOTE — Telephone Encounter (Signed)
I can do referral if patient really prefers this, but tell him I could see him for this first and then we could refer him after that IF we feel like it is needed.  Let me know-thx

## 2017-12-09 NOTE — Telephone Encounter (Signed)
Copied from [place] [phone]. Topic: Referral - Request for Referral >> Dec 09, 2017  7:55 AM Scherrie Gerlach wrote: Has patient seen PCP for this complaint? yes Referral for which specialty: orthopedics Preferred provider/office: Dr Isabella Stalling Reason for referral: right shoulder pain

## 2017-12-09 NOTE — Telephone Encounter (Signed)
SW pts wife, okay per DPR. She stated that pt really wants to be referred to Dr. Noemi Chapel.

## 2017-12-09 NOTE — Telephone Encounter (Signed)
Pt last seen 05/2016 for right shoulder pain.   Please advise. Thanks.

## 2017-12-12 DIAGNOSIS — G8929 Other chronic pain: Secondary | ICD-10-CM

## 2017-12-12 DIAGNOSIS — M25511 Pain in right shoulder: Secondary | ICD-10-CM

## 2017-12-12 DIAGNOSIS — M19012 Primary osteoarthritis, left shoulder: Secondary | ICD-10-CM | POA: Diagnosis not present

## 2017-12-12 HISTORY — DX: Other chronic pain: G89.29

## 2017-12-12 HISTORY — DX: Pain in right shoulder: M25.511

## 2017-12-25 ENCOUNTER — Telehealth: Payer: Self-pay | Admitting: Family Medicine

## 2017-12-25 MED ORDER — SILDENAFIL CITRATE 100 MG PO TABS: 100.0000 mg | ORAL_TABLET | Freq: Every day | ORAL | 3 refills | Status: DC | PRN

## 2017-12-25 NOTE — Telephone Encounter (Signed)
Pt advised and voiced understanding.   

## 2017-12-25 NOTE — Telephone Encounter (Signed)
OK, viagra eRx'd to [facility].

## 2017-12-25 NOTE — Telephone Encounter (Signed)
Please advise. Thanks.  Looks like Dr. Anitra Lauth has not Rx'ed this med for pt.

## 2017-12-25 NOTE — Telephone Encounter (Signed)
Copied from [place] [phone]. Topic: Quick Communication - See Telephone Encounter >> Dec 25, 2017  9:27 AM Conception Chancy, NT wrote: CRM for notification. See Telephone encounter for: 12/25/17.  Patient is calling and requesting a refill on sildenafil (VIAGRA) 100 MG tablet. He states his previous doctor prescribed and he would like to know if 25 tablets can be called in.  [id number] - [place], [place] - [address] [place] [address] Phone: [phone] Fax: [phone]

## 2017-12-26 MED ORDER — SILDENAFIL CITRATE 100 MG PO TABS: 100.0000 mg | ORAL_TABLET | Freq: Every day | ORAL | 3 refills | Status: DC | PRN

## 2017-12-26 NOTE — Telephone Encounter (Signed)
SW pt, he stated that [facility] was going to charge [facility] $200 for this medication. He said he can get it cheaper somewhere else. Pt is requesting hard Rx.   Per Dr. Anitra Lauth okay to print Rx to give to pt.

## 2017-12-26 NOTE — Telephone Encounter (Signed)
Pt is calling and requesting hand written rx for generic viagra 100 mg. #25 with refills. Pt will fax rx

## 2017-12-26 NOTE — Telephone Encounter (Signed)
Pt advised and voiced understanding.   

## 2017-12-26 NOTE — Telephone Encounter (Signed)
Rx printed and signed and put up front for pick up.

## 2017-12-26 NOTE — Addendum Note (Signed)
Addended by: Onalee Hua on: 12/26/2017 09:14 AM   Modules accepted: Orders

## 2018-01-14 ENCOUNTER — Encounter: Payer: Self-pay | Admitting: Family Medicine

## 2018-01-28 ENCOUNTER — Other Ambulatory Visit: Payer: Self-pay | Admitting: Orthopaedic Surgery

## 2018-01-28 DIAGNOSIS — M25511 Pain in right shoulder: Secondary | ICD-10-CM

## 2018-01-28 DIAGNOSIS — M19012 Primary osteoarthritis, left shoulder: Secondary | ICD-10-CM | POA: Diagnosis not present

## 2018-02-02 ENCOUNTER — Encounter: Payer: Self-pay | Admitting: Family Medicine

## 2018-02-03 ENCOUNTER — Ambulatory Visit
Admission: RE | Admit: 2018-02-03 | Discharge: 2018-02-03 | Disposition: A | Discharge status: Home / Self Care | Payer: Medicare HMO | Source: Ambulatory Visit | Attending: Orthopaedic Surgery | Admitting: Orthopaedic Surgery

## 2018-02-03 DIAGNOSIS — M25511 Pain in right shoulder: Secondary | ICD-10-CM

## 2018-02-03 DIAGNOSIS — M19011 Primary osteoarthritis, right shoulder: Secondary | ICD-10-CM | POA: Diagnosis not present

## 2018-03-12 ENCOUNTER — Encounter (HOSPITAL_COMMUNITY): Payer: Self-pay

## 2018-03-12 ENCOUNTER — Encounter (HOSPITAL_COMMUNITY)
Admission: RE | Admit: 2018-03-12 | Discharge: 2018-03-12 | Disposition: A | Discharge status: Home / Self Care | Payer: Medicare HMO | Source: Ambulatory Visit | Attending: Orthopaedic Surgery | Admitting: Orthopaedic Surgery

## 2018-03-12 ENCOUNTER — Other Ambulatory Visit: Payer: Self-pay

## 2018-03-12 DIAGNOSIS — M75101 Unspecified rotator cuff tear or rupture of right shoulder, not specified as traumatic: Secondary | ICD-10-CM | POA: Insufficient documentation

## 2018-03-12 DIAGNOSIS — Z01812 Encounter for preprocedural laboratory examination: Secondary | ICD-10-CM | POA: Diagnosis not present

## 2018-03-12 HISTORY — DX: Unspecified osteoarthritis, unspecified site: M19.90

## 2018-03-12 LAB — CBC
HCT: 45.7 % (ref 39.0–52.0)
Hemoglobin: 14.4 g/dL (ref 13.0–17.0)
MCH: 32.6 pg (ref 26.0–34.0)
MCHC: 31.5 g/dL (ref 30.0–36.0)
MCV: 103.4 fL — ABNORMAL HIGH (ref 80.0–100.0)
Platelets: 299 10*3/uL (ref 150–400)
RBC: 4.42 MIL/uL (ref 4.22–5.81)
RDW: 13.5 % (ref 11.5–15.5)
WBC: 6.5 10*3/uL (ref 4.0–10.5)
nRBC: 0 % (ref 0.0–0.2)

## 2018-03-12 LAB — SURGICAL PCR SCREEN
MRSA, PCR: NEGATIVE
Staphylococcus aureus: POSITIVE — AB

## 2018-03-12 NOTE — Patient Instructions (Addendum)
Paul Knight  03/12/2018   Your procedure is scheduled on: 03-19-18   Report to [facility] Main  Entrance              Report to admitting at       [place] AM    Call this number if you have problems the morning of surgery [phone]    Remember: Do not eat food or drink liquids :After Midnight.             BRUSH YOUR TEETH MORNING OF SURGERY AND RINSE YOUR MOUTH OUT, NO CHEWING GUM CANDY OR MINTS.     Take these medicines the morning of surgery with A SIP OF WATER: NONE                                You may not have any metal on your body including hair pins and              piercings  Do not wear jewelry,  lotions, powders or perfumes, deodorant                       Men may shave face and neck.   Do not bring valuables to the hospital. [place].  Contacts, dentures or bridgework may not be worn into surgery.  Leave suitcase in the car. After surgery it may be brought to your room.                  Please read over the following fact sheets you were given: _____________________________________________________________________          [facility] - Preparing for Surgery Before surgery, you can play an important role.  Because skin is not sterile, your skin needs to be as free of germs as possible.  You can reduce the number of germs on your skin by washing with CHG (chlorahexidine gluconate) soap before surgery.  CHG is an antiseptic cleaner which kills germs and bonds with the skin to continue killing germs even after washing. Please DO NOT use if you have an allergy to CHG or antibacterial soaps.  If your skin becomes reddened/irritated stop using the CHG and inform your nurse when you arrive at Short Stay. Do not shave (including legs and underarms) for at least 48 hours prior to the first CHG shower.  You may shave your face/neck. Please follow these instructions carefully:  1.  Shower with CHG Soap  the night before surgery and the  morning of Surgery.  2.  If you choose to wash your hair, wash your hair first as usual with your  normal  shampoo.  3.  After you shampoo, rinse your hair and body thoroughly to remove the  shampoo.                           4.  Use CHG as you would any other liquid soap.  You can apply chg directly  to the skin and wash                       Gently with a scrungie or clean washcloth.  5.  Apply  the CHG Soap to your body ONLY FROM THE NECK DOWN.   Do not use on face/ open                           Wound or open sores. Avoid contact with eyes, ears mouth and genitals (private parts).                       Wash face,  Genitals (private parts) with your normal soap.             6.  Wash thoroughly, paying special attention to the area where your surgery  will be performed.  7.  Thoroughly rinse your body with warm water from the neck down.  8.  DO NOT shower/wash with your normal soap after using and rinsing off  the CHG Soap.                9.  Pat yourself dry with a clean towel.            10.  Wear clean pajamas.            11.  Place clean sheets on your bed the night of your first shower and do not  sleep with pets. Day of Surgery : Do not apply any lotions/deodorants the morning of surgery.  Please wear clean clothes to the hospital/surgery center.  FAILURE TO FOLLOW THESE INSTRUCTIONS MAY RESULT IN THE CANCELLATION OF YOUR SURGERY PATIENT SIGNATURE_________________________________  NURSE SIGNATURE__________________________________  ________________________________________________________________________   Paul Knight  An incentive spirometer is a tool that can help keep your lungs clear and active. This tool measures how well you are filling your lungs with each breath. Taking long deep breaths may help reverse or decrease the chance of developing breathing (pulmonary) problems (especially infection) following:  A long period of time when  you are unable to move or be active. BEFORE THE PROCEDURE   If the spirometer includes an indicator to show your best effort, your nurse or respiratory therapist will set it to a desired goal.  If possible, sit up straight or lean slightly forward. Try not to slouch.  Hold the incentive spirometer in an upright position. INSTRUCTIONS FOR USE  1. Sit on the edge of your bed if possible, or sit up as far as you can in bed or on a chair. 2. Hold the incentive spirometer in an upright position. 3. Breathe out normally. 4. Place the mouthpiece in your mouth and seal your lips tightly around it. 5. Breathe in slowly and as deeply as possible, raising the piston or the ball toward the top of the column. 6. Hold your breath for 3-5 seconds or for as long as possible. Allow the piston or ball to fall to the bottom of the column. 7. Remove the mouthpiece from your mouth and breathe out normally. 8. Rest for a few seconds and repeat Steps 1 through 7 at least 10 times every 1-2 hours when you are awake. Take your time and take a few normal breaths between deep breaths. 9. The spirometer may include an indicator to show your best effort. Use the indicator as a goal to work toward during each repetition. 10. After each set of 10 deep breaths, practice coughing to be sure your lungs are clear. If you have an incision (the cut made at the time of surgery), support your incision when coughing by placing a pillow or rolled  up towels firmly against it. Once you are able to get out of bed, walk around indoors and cough well. You may stop using the incentive spirometer when instructed by your caregiver.  RISKS AND COMPLICATIONS  Take your time so you do not get dizzy or light-headed.  If you are in pain, you may need to take or ask for pain medication before doing incentive spirometry. It is harder to take a deep breath if you are having pain. AFTER USE  Rest and breathe slowly and easily.  It can be  helpful to keep track of a log of your progress. Your caregiver can provide you with a simple table to help with this. If you are using the spirometer at home, follow these instructions: [place] IF:   You are having difficultly using the spirometer.  You have trouble using the spirometer as often as instructed.  Your pain medication is not giving enough relief while using the spirometer.  You develop fever of 100.5 F (38.1 C) or higher. SEEK IMMEDIATE MEDICAL CARE IF:   You cough up bloody sputum that had not been present before.  You develop fever of 102 F (38.9 C) or greater.  You develop worsening pain at or near the incision site. MAKE SURE YOU:   Understand these instructions.  Will watch your condition.  Will get help right away if you are not doing well or get worse. Document Released: 05/07/2006 Document Revised: 03/19/2011 Document Reviewed: 07/08/2006 [facility] Patient Information 2014 [place], [place].   ________________________________________________________________________

## 2018-03-19 ENCOUNTER — Inpatient Hospital Stay (HOSPITAL_COMMUNITY)
Admission: RE | Admit: 2018-03-19 | Discharge: 2018-03-20 | DRG: 483 | Disposition: A | Discharge status: Home / Self Care | Payer: Medicare HMO | Attending: Orthopaedic Surgery | Admitting: Orthopaedic Surgery

## 2018-03-19 ENCOUNTER — Inpatient Hospital Stay (HOSPITAL_COMMUNITY): Payer: Medicare HMO | Admitting: Anesthesiology

## 2018-03-19 ENCOUNTER — Encounter (HOSPITAL_COMMUNITY)
Admission: RE | Disposition: A | Discharge status: Home / Self Care | Payer: Self-pay | Source: Home / Self Care | Attending: Orthopaedic Surgery

## 2018-03-19 ENCOUNTER — Inpatient Hospital Stay (HOSPITAL_COMMUNITY): Payer: Medicare HMO | Admitting: Physician Assistant

## 2018-03-19 ENCOUNTER — Encounter (HOSPITAL_COMMUNITY): Payer: Self-pay | Admitting: Anesthesiology

## 2018-03-19 ENCOUNTER — Other Ambulatory Visit: Payer: Self-pay

## 2018-03-19 ENCOUNTER — Inpatient Hospital Stay (HOSPITAL_COMMUNITY): Payer: Medicare HMO

## 2018-03-19 DIAGNOSIS — Z87891 Personal history of nicotine dependence: Secondary | ICD-10-CM

## 2018-03-19 DIAGNOSIS — Z8249 Family history of ischemic heart disease and other diseases of the circulatory system: Secondary | ICD-10-CM

## 2018-03-19 DIAGNOSIS — Z7951 Long term (current) use of inhaled steroids: Secondary | ICD-10-CM | POA: Diagnosis not present

## 2018-03-19 DIAGNOSIS — M19011 Primary osteoarthritis, right shoulder: Secondary | ICD-10-CM | POA: Diagnosis not present

## 2018-03-19 DIAGNOSIS — M25511 Pain in right shoulder: Secondary | ICD-10-CM | POA: Diagnosis present

## 2018-03-19 DIAGNOSIS — Z96611 Presence of right artificial shoulder joint: Secondary | ICD-10-CM | POA: Diagnosis not present

## 2018-03-19 DIAGNOSIS — Z09 Encounter for follow-up examination after completed treatment for conditions other than malignant neoplasm: Secondary | ICD-10-CM

## 2018-03-19 DIAGNOSIS — M12812 Other specific arthropathies, not elsewhere classified, left shoulder: Secondary | ICD-10-CM | POA: Diagnosis present

## 2018-03-19 DIAGNOSIS — Z471 Aftercare following joint replacement surgery: Secondary | ICD-10-CM | POA: Diagnosis not present

## 2018-03-19 DIAGNOSIS — M12811 Other specific arthropathies, not elsewhere classified, right shoulder: Secondary | ICD-10-CM | POA: Diagnosis present

## 2018-03-19 DIAGNOSIS — Z8041 Family history of malignant neoplasm of ovary: Secondary | ICD-10-CM | POA: Diagnosis not present

## 2018-03-19 DIAGNOSIS — M75101 Unspecified rotator cuff tear or rupture of right shoulder, not specified as traumatic: Secondary | ICD-10-CM

## 2018-03-19 HISTORY — PX: REVERSE SHOULDER ARTHROPLASTY: SHX5054

## 2018-03-19 SURGERY — ARTHROPLASTY, SHOULDER, TOTAL, REVERSE
Anesthesia: Regional | Laterality: Right

## 2018-03-19 MED ORDER — CHLORHEXIDINE GLUCONATE 4 % EX LIQD: 60.0000 mL | Freq: Once | CUTANEOUS | Status: DC

## 2018-03-19 MED ORDER — ACETAMINOPHEN 10 MG/ML IV SOLN
INTRAVENOUS | Status: AC
  Administered 2018-03-19: 1000 mg via INTRAVENOUS
  Filled 2018-03-19: qty 100

## 2018-03-19 MED ORDER — MIDAZOLAM HCL 2 MG/2ML IJ SOLN
INTRAMUSCULAR | Status: AC
  Filled 2018-03-19: qty 2

## 2018-03-19 MED ORDER — CEFAZOLIN SODIUM-DEXTROSE 2-4 GM/100ML-% IV SOLN
2.0000 g | INTRAVENOUS | Status: AC
  Administered 2018-03-19: 2 g via INTRAVENOUS
  Filled 2018-03-19: qty 100

## 2018-03-19 MED ORDER — SODIUM CHLORIDE 0.9 % IV SOLN
INTRAVENOUS | Status: DC | PRN
  Administered 2018-03-19: 20 ug/min via INTRAVENOUS

## 2018-03-19 MED ORDER — DIPHENHYDRAMINE HCL 12.5 MG/5ML PO ELIX: 12.5000 mg | ORAL_SOLUTION | ORAL | Status: DC | PRN

## 2018-03-19 MED ORDER — OXYCODONE HCL 5 MG PO TABS: 5.0000 mg | ORAL_TABLET | ORAL | Status: DC | PRN

## 2018-03-19 MED ORDER — OXYCODONE HCL 5 MG/5ML PO SOLN: 5.0000 mg | Freq: Once | ORAL | Status: DC | PRN

## 2018-03-19 MED ORDER — DEXAMETHASONE SODIUM PHOSPHATE 10 MG/ML IJ SOLN
INTRAMUSCULAR | Status: AC
  Filled 2018-03-19: qty 1

## 2018-03-19 MED ORDER — LACTATED RINGERS IV SOLN
INTRAVENOUS | Status: DC
  Administered 2018-03-19: 07:00:00 via INTRAVENOUS

## 2018-03-19 MED ORDER — OXYCODONE HCL 5 MG PO TABS: 5.0000 mg | ORAL_TABLET | Freq: Once | ORAL | Status: DC | PRN

## 2018-03-19 MED ORDER — ONDANSETRON HCL 4 MG PO TABS: 4.0000 mg | ORAL_TABLET | Freq: Four times a day (QID) | ORAL | Status: DC | PRN

## 2018-03-19 MED ORDER — ROCURONIUM BROMIDE 100 MG/10ML IV SOLN
INTRAVENOUS | Status: AC
  Filled 2018-03-19: qty 1

## 2018-03-19 MED ORDER — SUGAMMADEX SODIUM 200 MG/2ML IV SOLN
INTRAVENOUS | Status: DC | PRN
  Administered 2018-03-19: 200 mg via INTRAVENOUS

## 2018-03-19 MED ORDER — FENTANYL CITRATE (PF) 100 MCG/2ML IJ SOLN
INTRAMUSCULAR | Status: DC | PRN
  Administered 2018-03-19 (×4): 50 ug via INTRAVENOUS

## 2018-03-19 MED ORDER — ONDANSETRON HCL 4 MG/2ML IJ SOLN
INTRAMUSCULAR | Status: AC
  Filled 2018-03-19: qty 2

## 2018-03-19 MED ORDER — FENTANYL CITRATE (PF) 100 MCG/2ML IJ SOLN
INTRAMUSCULAR | Status: AC
  Filled 2018-03-19: qty 2

## 2018-03-19 MED ORDER — VANCOMYCIN HCL 1000 MG IV SOLR
INTRAVENOUS | Status: AC
  Filled 2018-03-19: qty 1000

## 2018-03-19 MED ORDER — METOCLOPRAMIDE HCL 5 MG PO TABS: 5.0000 mg | ORAL_TABLET | Freq: Three times a day (TID) | ORAL | Status: DC | PRN

## 2018-03-19 MED ORDER — ACETAMINOPHEN 10 MG/ML IV SOLN
1000.0000 mg | Freq: Once | INTRAVENOUS | Status: DC | PRN
  Administered 2018-03-19: 1000 mg via INTRAVENOUS

## 2018-03-19 MED ORDER — SODIUM CHLORIDE 0.9 % IR SOLN
Status: DC | PRN
  Administered 2018-03-19: 1000 mL

## 2018-03-19 MED ORDER — PROPOFOL 10 MG/ML IV BOLUS
INTRAVENOUS | Status: DC | PRN
  Administered 2018-03-19: 140 mg via INTRAVENOUS

## 2018-03-19 MED ORDER — ONDANSETRON HCL 4 MG/2ML IJ SOLN: 4.0000 mg | Freq: Four times a day (QID) | INTRAMUSCULAR | Status: DC | PRN

## 2018-03-19 MED ORDER — MIDAZOLAM HCL 5 MG/5ML IJ SOLN
INTRAMUSCULAR | Status: DC | PRN
  Administered 2018-03-19: 1 mg via INTRAVENOUS

## 2018-03-19 MED ORDER — ACETAMINOPHEN 160 MG/5ML PO SOLN: 1000.0000 mg | Freq: Once | ORAL | Status: DC | PRN

## 2018-03-19 MED ORDER — ZOLPIDEM TARTRATE 5 MG PO TABS: 5.0000 mg | ORAL_TABLET | Freq: Every evening | ORAL | Status: DC | PRN

## 2018-03-19 MED ORDER — CELECOXIB 200 MG PO CAPS
200.0000 mg | ORAL_CAPSULE | Freq: Two times a day (BID) | ORAL | Status: DC
  Administered 2018-03-19 – 2018-03-20 (×2): 200 mg via ORAL
  Filled 2018-03-19 (×2): qty 1

## 2018-03-19 MED ORDER — HYDROMORPHONE HCL 1 MG/ML IJ SOLN: 0.5000 mg | INTRAMUSCULAR | Status: DC | PRN

## 2018-03-19 MED ORDER — FENTANYL CITRATE (PF) 100 MCG/2ML IJ SOLN: 25.0000 ug | INTRAMUSCULAR | Status: DC | PRN

## 2018-03-19 MED ORDER — ACETAMINOPHEN 500 MG PO TABS
1000.0000 mg | ORAL_TABLET | Freq: Three times a day (TID) | ORAL | Status: DC
  Administered 2018-03-19 – 2018-03-20 (×3): 1000 mg via ORAL
  Filled 2018-03-19 (×3): qty 2

## 2018-03-19 MED ORDER — CEFAZOLIN SODIUM-DEXTROSE 1-4 GM/50ML-% IV SOLN
1.0000 g | Freq: Four times a day (QID) | INTRAVENOUS | Status: AC
  Administered 2018-03-19 – 2018-03-20 (×3): 1 g via INTRAVENOUS
  Filled 2018-03-19 (×3): qty 50

## 2018-03-19 MED ORDER — ROCURONIUM BROMIDE 10 MG/ML (PF) SYRINGE
PREFILLED_SYRINGE | INTRAVENOUS | Status: DC | PRN
  Administered 2018-03-19: 60 mg via INTRAVENOUS

## 2018-03-19 MED ORDER — ACETAMINOPHEN 500 MG PO TABS: 1000.0000 mg | ORAL_TABLET | Freq: Once | ORAL | Status: DC | PRN

## 2018-03-19 MED ORDER — TRANEXAMIC ACID-NACL 1000-0.7 MG/100ML-% IV SOLN
1000.0000 mg | INTRAVENOUS | Status: AC
  Administered 2018-03-19: 1000 mg via INTRAVENOUS
  Filled 2018-03-19: qty 100

## 2018-03-19 MED ORDER — STERILE WATER FOR IRRIGATION IR SOLN
Status: DC | PRN
  Administered 2018-03-19: 1000 mL

## 2018-03-19 MED ORDER — PHENYLEPHRINE HCL 10 MG/ML IJ SOLN
INTRAMUSCULAR | Status: AC
  Filled 2018-03-19: qty 1

## 2018-03-19 MED ORDER — PROPOFOL 10 MG/ML IV BOLUS
INTRAVENOUS | Status: AC
  Filled 2018-03-19: qty 40

## 2018-03-19 MED ORDER — METOCLOPRAMIDE HCL 5 MG/ML IJ SOLN: 5.0000 mg | Freq: Three times a day (TID) | INTRAMUSCULAR | Status: DC | PRN

## 2018-03-19 MED ORDER — SUGAMMADEX SODIUM 200 MG/2ML IV SOLN
INTRAVENOUS | Status: AC
  Filled 2018-03-19: qty 2

## 2018-03-19 MED ORDER — DOCUSATE SODIUM 100 MG PO CAPS
100.0000 mg | ORAL_CAPSULE | Freq: Two times a day (BID) | ORAL | Status: DC
  Administered 2018-03-19 – 2018-03-20 (×2): 100 mg via ORAL
  Filled 2018-03-19 (×2): qty 1

## 2018-03-19 MED ORDER — VANCOMYCIN HCL 1000 MG IV SOLR
INTRAVENOUS | Status: DC | PRN
  Administered 2018-03-19: 1000 mg via TOPICAL

## 2018-03-19 MED ORDER — DEXAMETHASONE SODIUM PHOSPHATE 10 MG/ML IJ SOLN
INTRAMUSCULAR | Status: DC | PRN
  Administered 2018-03-19: 8 mg via INTRAVENOUS

## 2018-03-19 MED ORDER — ONDANSETRON HCL 4 MG/2ML IJ SOLN
INTRAMUSCULAR | Status: DC | PRN
  Administered 2018-03-19: 4 mg via INTRAVENOUS

## 2018-03-19 SURGICAL SUPPLY — 74 items
BASEPLATE SHOULDER FW 15D 29 (Joint) ×2 IMPLANT
BIT DRILL 2.0X128 (BIT) ×2 IMPLANT
BIT DRILL 2.0X128MM (BIT) ×1
BIT DRILL 3.2 PERIPHERAL SCREW (BIT) ×2 IMPLANT
BLADE EXTENDED COATED 6.5IN (ELECTRODE) ×6 IMPLANT
BLADE SAW SAG 73X25 THK (BLADE) ×2
BLADE SAW SGTL 73X25 THK (BLADE) ×1 IMPLANT
CHLORAPREP W/TINT 26ML (MISCELLANEOUS) ×4 IMPLANT
CLOSURE STERI-STRIP 1/2X4 (GAUZE/BANDAGES/DRESSINGS) ×1
CLSR STERI-STRIP ANTIMIC 1/2X4 (GAUZE/BANDAGES/DRESSINGS) ×2 IMPLANT
COVER MAYO STAND STRL (DRAPES) ×3 IMPLANT
COVER SURGICAL LIGHT HANDLE (MISCELLANEOUS) ×3 IMPLANT
COVER WAND RF STERILE (DRAPES) ×3 IMPLANT
DRAPE C-ARM 42X120 X-RAY (DRAPES) ×1 IMPLANT
DRAPE INCISE IOBAN 66X45 STRL (DRAPES) ×3 IMPLANT
DRAPE ORTHO SPLIT 77X108 STRL (DRAPES) ×4
DRAPE SHEET LG 3/4 BI-LAMINATE (DRAPES) ×3 IMPLANT
DRAPE SURG ORHT 6 SPLT 77X108 (DRAPES) ×2 IMPLANT
DRSG AQUACEL AG ADV 3.5X 6 (GAUZE/BANDAGES/DRESSINGS) ×3 IMPLANT
ELECT BLADE TIP CTD 4 INCH (ELECTRODE) ×3 IMPLANT
ELECT REM PT RETURN 15FT ADLT (MISCELLANEOUS) ×3 IMPLANT
GLENOSPHERE STANDARD 39 (Joint) ×3 IMPLANT
GLENOSPHERE STD 39 (Joint) IMPLANT
GLOVE BIOGEL PI IND STRL 8 (GLOVE) ×2 IMPLANT
GLOVE BIOGEL PI INDICATOR 8 (GLOVE) ×4
GLOVE ECLIPSE 8.0 STRL XLNG CF (GLOVE) ×6 IMPLANT
GLOVE SURG ORTHO 8.0 STRL STRW (GLOVE) ×3 IMPLANT
GOWN SRG 2XL LVL 4 BRTHBL STRL (GOWNS) ×1 IMPLANT
GOWN STRL NON-REIN 2XL LVL4 (GOWNS) ×2
GOWN STRL REUS W/ TWL LRG LVL3 (GOWN DISPOSABLE) ×1 IMPLANT
GOWN STRL REUS W/ TWL XL LVL3 (GOWN DISPOSABLE) ×1 IMPLANT
GOWN STRL REUS W/TWL LRG LVL3 (GOWN DISPOSABLE) ×2
GOWN STRL REUS W/TWL XL LVL3 (GOWN DISPOSABLE) ×2
GUIDEWIRE GLENOID 2.5X220 (WIRE) ×2 IMPLANT
HANDPIECE INTERPULSE COAX TIP (DISPOSABLE) ×2
HEMOSTAT SURGICEL 2X14 (HEMOSTASIS) ×3 IMPLANT
IMPL REVERSE SHOULDER 0X3.5 (Shoulder) IMPLANT
IMPLANT REVERSE SHOULDER 0X3.5 (Shoulder) ×3 IMPLANT
INSERT SHOULDER REVERSED (Miscellaneous) IMPLANT
KIT BASIN OR (CUSTOM PROCEDURE TRAY) ×3 IMPLANT
KIT STABILIZATION SHOULDER (MISCELLANEOUS) ×3 IMPLANT
KIT TURNOVER KIT A (KITS) ×2 IMPLANT
MANIFOLD NEPTUNE II (INSTRUMENTS) ×3 IMPLANT
NDL HYPO 25X1 1.5 SAFETY (NEEDLE) IMPLANT
NDL MA TROC 1/2 (NEEDLE) ×1 IMPLANT
NEEDLE HYPO 25X1 1.5 SAFETY (NEEDLE) IMPLANT
NEEDLE MA TROC 1/2 (NEEDLE) ×3 IMPLANT
NS IRRIG 1000ML POUR BTL (IV SOLUTION) ×3 IMPLANT
PACK SHOULDER (CUSTOM PROCEDURE TRAY) ×3 IMPLANT
PASSER SUT SWANSON 36MM LOOP (INSTRUMENTS) ×3 IMPLANT
RESTRAINT HEAD UNIVERSAL NS (MISCELLANEOUS) ×3 IMPLANT
REVERSED INSERT SHOULDER (Miscellaneous) ×3 IMPLANT
SCREW 5.5X22 (Screw) ×2 IMPLANT
SCREW BONE 6.5X40 SM (Screw) ×2 IMPLANT
SCREW PERIPHERAL 30 (Screw) ×4 IMPLANT
SCREW PERIPHERAL 42 (Screw) ×2 IMPLANT
SET HNDPC FAN SPRY TIP SCT (DISPOSABLE) ×1 IMPLANT
SLING ULTRA III MED (ORTHOPEDIC SUPPLIES) ×3 IMPLANT
SMARTMIX MINI TOWER (MISCELLANEOUS) ×3
SPONGE LAP 18X18 X RAY DECT (DISPOSABLE) IMPLANT
STEM HUMERAL SZ4B STD 78 PTC (Stem) ×2 IMPLANT
SUCTION FRAZIER HANDLE 10FR (MISCELLANEOUS) ×2
SUCTION TUBE FRAZIER 10FR DISP (MISCELLANEOUS) ×1 IMPLANT
SUT ETHIBOND 2 OS 4 DA (SUTURE) ×6 IMPLANT
SUT ETHIBOND 2 V 37 (SUTURE) ×3 IMPLANT
SUT ETHIBOND NAB CT1 #1 30IN (SUTURE) ×3 IMPLANT
SUT FIBERWIRE #5 38 BLUE (WIRE) ×12 IMPLANT
SUT MNCRL AB 3-0 PS2 18 (SUTURE) ×3 IMPLANT
SUT VIC AB 2-0 CT1 27 (SUTURE) ×2
SUT VIC AB 2-0 CT1 TAPERPNT 27 (SUTURE) ×1 IMPLANT
TAPE LABRALWHITE 1.5X36 (TAPE) ×4 IMPLANT
TAPE SUT LABRALTAP WHT/BLK (SUTURE) ×4 IMPLANT
TOWEL OR 17X26 10 PK STRL BLUE (TOWEL DISPOSABLE) ×3 IMPLANT
TOWER SMARTMIX MINI (MISCELLANEOUS) ×1 IMPLANT

## 2018-03-19 NOTE — Op Note (Signed)
Orthopaedic Surgery Operative Note (CSN: [id number])  Paul Knight  Aug 21, 1940 Date of Surgery: 03/19/2018   Diagnoses:  Right rotator cuff arthropathy  Procedure: Right Reverse  Total Shoulder Arthroplasty   Operative Finding Successful completion of planned procedure.  Great stability in the patient good motion preop so we were leaving him relatively on the looser side of normal but well within normal.  Patient good motion of the end of the case were happy with fixation of his augmented a wedge glenoid.  Subscap and his cuff with the exception of the teres was completely torn thus we did a capsular type repair just for stability sake in the front of the shoulder.  Implants: #4 stem be, high offset standard tray, 9 poly-, 39 mm glenosphere, 29 full wedge augment baseplate with a 40 center screw 6.5.  Post-operative plan: The patient will be NWB in sling.  The patient will be admitted overnight.  DVT prophylaxis not indicated in isolated upper extremity surgery patient with no specific risks factors.  Pain control with PRN pain medication preferring oral medicines.  Follow up plan will be scheduled in approximately 7 days for incision check and XR.  Physical therapy to start after first visit.  Post-Op Diagnosis: Same Surgeons:Primary: Hiram Gash, MD Assistants:Brandon Lynnell Jude Location: [facility] ROOM 06 Anesthesia: Choice Antibiotics: Ancef 2g preop, Vancomycin 1067m locally Tourniquet time: None Estimated Blood Loss: 1481Complications: None Specimens: None Implants: Implant Name Type Inv. Item Serial No. Manufacturer Lot No. LRB No. Used Action  SHOULDER BASEPLATE 15DEG 29 - [id number] SHOULDER BASEPLATE 15DEG 29  [employer] [id number] 1 Implanted  central screw      Right 1 Implanted  peripheral screw      Right 1 Implanted  GLENOSPHERE STANDARD 39 - [id number] GLENOSPHERE STANDARD 39  [employer] [id number] 1 Implanted  peripheral screw      Right 1  Implanted  peripheral screw      Right 2 Implanted  IMPLANT REVERSE SHOULDER 0X3.5 - [id number] IMPLANT REVERSE SHOULDER [id number] 1 Implanted  reversed insert- [id number] + [id number]   01 [id number] 2 [employer] [id number] 1 Implanted  STEM HUMERAL SZ4B STD 78 PTC - [id number] STEM HUMERAL SZ4B STD 732PTC  [id number] 1 Implanted    Indications for Surgery:   Paul Knight a 78y.o. male with continued right shoulder pain refractory to nonoperative measures in setting of cuff tear arthropathy with significant posterior superior wear..  Benefits and risks of operative and nonoperative management were discussed prior to surgery with patient/guardian(s) and informed consent form was completed.  Infection and need for further surgery were discussed as was prosthetic stability and cuff issues.  We additionally specifically discussed risks of axillary nerve injury, infection, periprosthetic fracture, continued pain and longevity of implants prior to beginning procedure.      Procedure:   The patient was identified in the preoperative holding area where the surgical site was marked. Block placed by anesthesia with exparel.  The patient was taken to the OR where a procedural timeout was called and the above noted anesthesia was induced.  The patient was positioned beachchair on allen table with spider arm positioner.  Preoperative antibiotics were dosed.  The patient's right shoulder was prepped and draped in the usual sterile fashion.  A second preoperative timeout was called.       Standard deltopectoral approach was performed with a #  10 blade. We dissected down to the subcutaneous tissues and the cephalic vein was taken laterally with the deltoid. Clavipectoral fascia was incised in line with the incision. Deep retractors were placed. The long of the biceps tendon was identified and there was significant tenosynovitis present.  Tenodesis was  performed to the pectoralis tendon with #2 Ethibond. The remaining biceps was followed up into the rotator interval where it was released.   The subscapularis was taken down in a full thickness layer with capsule along the humeral neck extending inferiorly around the humeral head. We continued releasing the capsule directly off of the osteophytes inferiorly all the way around the corner. This allowed [place] to dislocate the humeral head.   The humeral head had evidence of severe osteoarthritic wear with full-thickness cartilage loss and exposed subchondral bone. There was significant flattening of the humeral head.   The rotator cuff was carefully examined and noted to be irreperably torn.  The decision was confirmed that a reverse total shoulder was indicated for this patient.  There were osteophytes along the inferior humeral neck. The osteophytes were removed with an osteotome and a rongeur.  Osteophytes were removed with a rongeur and an osteotome and the anatomic neck was well visualized.     A humeral cutting guide was inserted down the intramedullary canal. The version was set at 20 of retroversion. Humeral osteotomy was performed with an oscillating saw. The head fragment was passed off the back table. A starter awl was used to open the humeral canal. We next used T-handle straight sound reamers to ream up to an appropriate fit. A chisel was used to remove proximal humeral bone. We then broached starting with a size one broach and broaching up to 4 which obtained an appropriate fit. The broach handle was removed. A cut protector was placed. The broach handle was removed and a cut protector was placed. The humerus was retracted posteriorly and we turned our attention to glenoid exposure.  The subscapularis was again identified and immediately we took care to palpate the axillary nerve anteriorly and verify its position with gentle palpation as well as the tug test.  We then released the SGHL with  bovie cautery prior to placing a curved mayo at the junction of the anterior glenoid well above the axillary nerve and bluntly dissecting the subscapularis from the capsule.  We then carefully protected the axillary nerve as we gently released the inferior capsule to fully mobilize the subscapularis.  An anterior deltoid retractor was then placed as well as a small Hohmann retractor superiorly.   The glenoid was inspected and had evidence of severe osteoarthritic wear with full-thickness cartilage loss and exposed subchondral bone. The remaining labrum was removed circumferentially taking great care not to disrupt the posterior capsule.   The glenoid drill guide was placed and used to drill a guide pin in the center, inferior position. The glenoid face was then reamed using a full wedge reamer to 15 deg with the wedge superior posterior to recreate normal anatomy. Patient had to have bone removed anterior inferior to avoid superior tilt.  The center hole was drilled over the guidepin in a near anatomic angle of version. Next the  glenoid vault was drilled back to a depth of 40 mm.  We tapped and then placed a 59m full wedge size baseplate with 8 lateralization was selected with a 40 mm x 6.5 mm length central screw.  The base plate was screwed into the glenoid vault obtaining secure  fixation. We next placed 4 peripheral screws for additional fixation.  Next a 39 mm glenosphere was selected and impacted onto the baseplate. The center screw was tightened.  We turned attention back to the humeral side. The cut protector was removed. We trialed with multiple size tray and polyethylene options and selected a 9 which provided good stability and range of motion without excess soft tissue tension. The offset was dialed in to match the normal anatomy. The shoulder was trialed.  There was good ROM in all planes and the shoulder was stable with no inferior translation.  The real humeral implants were opened after  again confirming sizes.  The trial was removed. #5 Ethibond sutures passed through the humeral neck for subscap repair. The humeral component was press-fit obtaining a secure fit. A +0 high offset tray was selected and impacted onto the stem.  A 39+9 polyethylene liner was impacted onto the stem.  The joint was reduced and thoroughly irrigated with pulsatile lavage. Subscap was repaired back with #5 Ethibond sutures through bone tunnels. Hemostasis was obtained. The deltopectoral interval was reapproximated with #1 Ethibond. The subcutaneous tissues were closed with 2-0 Vicryl and the skin was closed with running monocryl.    The wounds were cleaned and dried and an Aquacel dressing was placed. The drapes taken down. The arm was placed into sling with abduction pillow. Patient was awakened, extubated, and transferred to the recovery room in stable condition. There were no intraoperative complications. The sponge, needle, and attention counts were  correct at the end of the case.     Joya Gaskins, OPA-C, present and scrubbed throughout the case, critical for completion in a timely fashion, and for retraction, instrumentation, closure.

## 2018-03-19 NOTE — Transfer of Care (Signed)
Immediate Anesthesia Transfer of Care Note  Patient: Paul Knight  Procedure(s) Performed: Procedure(s) with comments: REVERSE SHOULDER ARTHROPLASTY (Right) - with exparel  Patient Location: PACU  Anesthesia Type:GA combined with regional for post-op pain  Level of Consciousness:  sedated, patient cooperative and responds to stimulation  Airway & Oxygen Therapy:Patient Spontanous Breathing and Patient connected to face mask oxgen  Post-op Assessment:  Report given to PACU RN and Post -op Vital signs reviewed and stable  Post vital signs:  Reviewed and stable  Last Vitals:  Vitals:   03/19/18 0634 03/19/18 1038  BP: 126/80 121/65  Pulse: 76 66  Resp: 18   Temp: 36.5 C   SpO2: 051% 833%    Complications: No apparent anesthesia complications

## 2018-03-19 NOTE — Anesthesia Procedure Notes (Signed)
Procedure Name: Intubation Date/Time: 03/19/2018 9:04 AM Performed by: Lavina Hamman, CRNA Pre-anesthesia Checklist: Patient identified, Emergency Drugs available, Suction available, Patient being monitored and Timeout performed Patient Re-evaluated:Patient Re-evaluated prior to induction Oxygen Delivery Method: Circle system utilized Preoxygenation: Pre-oxygenation with 100% oxygen Induction Type: IV induction Ventilation: Mask ventilation without difficulty Laryngoscope Size: Mac and 4 Grade View: Grade I Tube type: Oral Tube size: 7.0 mm Number of attempts: 1 Airway Equipment and Method: Stylet Placement Confirmation: ETT inserted through vocal cords under direct vision,  positive ETCO2,  CO2 detector and breath sounds checked- equal and bilateral Secured at: 23 cm Tube secured with: Tape Dental Injury: Teeth and Oropharynx as per pre-operative assessment  Comments: ATOI

## 2018-03-19 NOTE — Anesthesia Preprocedure Evaluation (Addendum)
Anesthesia Evaluation  Patient identified by MRN, date of birth, ID band Patient awake    Reviewed: Allergy & Precautions, NPO status , Patient's Chart, lab work & pertinent test results  History of Anesthesia Complications Negative for: history of anesthetic complications  Airway Mallampati: I  TM Distance: >3 FB Neck ROM: Full    Dental  (+) Teeth Intact   Pulmonary former smoker,    breath sounds clear to auscultation       Cardiovascular negative cardio ROS   Rhythm:Regular     Neuro/Psych PSYCHIATRIC DISORDERS Depression  Neuromuscular disease    GI/Hepatic negative GI ROS, Neg liver ROS,   Endo/Other  negative endocrine ROS  Renal/GU negative Renal ROS     Musculoskeletal  (+) Arthritis ,   Abdominal   Peds  Hematology negative hematology ROS (+)   Anesthesia Other Findings   Reproductive/Obstetrics                            Anesthesia Physical Anesthesia Plan  ASA: II  Anesthesia Plan: General and Regional   Post-op Pain Management:  Regional for Post-op pain   Induction: Intravenous  PONV Risk Score and Plan: 2 and Ondansetron and Dexamethasone  Airway Management Planned: Oral ETT  Additional Equipment: None  Intra-op Plan:   Post-operative Plan: Extubation in OR  Informed Consent: I have reviewed the patients History and Physical, chart, labs and discussed the procedure including the risks, benefits and alternatives for the proposed anesthesia with the patient or authorized representative who has indicated his/her understanding and acceptance.     Dental advisory given  Plan Discussed with: CRNA and Surgeon  Anesthesia Plan Comments:        Anesthesia Quick Evaluation

## 2018-03-19 NOTE — H&P (Signed)
PREOPERATIVE H&P  Chief Complaint: djd right shoulder  HPI: Paul Knight is a 78 y.o. male who presents for preoperative history and physical with a diagnosis of djd right shoulder. Symptoms are rated as moderate to severe, and have been worsening.  This is significantly impairing activities of daily living.  Please see my clinic note for full details on this patient's care.  He has elected for surgical management.   Past Medical History:  Diagnosis Date  . Arthritis    shoulder  . Chronic right shoulder pain 12/12/2017   Steroid injection (Dr. Manus Gunning for reverse TSA as of 01/2018.  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 Erectile dysfunction   . Numbness in feet    ? Tarsal tunnel syndrome.  Ortho and neuro could not determine dx.     Past Surgical History:  Procedure Laterality Date  . APPENDECTOMY  1970s  . COLONOSCOPY  01/08/2009   Normal; recall 10 yrs  . TONSILLECTOMY  as a child   Social History   Socioeconomic History  . Marital status: Married    Spouse name: Not on file  . Number of children: 4  . Years of education: Not on file  . Highest education level: Not on file  Occupational History  . Occupation: Retired  Scientific laboratory technician  . Financial resource strain: Not on file  . Food insecurity:    Worry: Not on file    Inability: Not on file  . Transportation needs:    Medical: Not on file    Non-medical: Not on file  Tobacco Use  . Smoking status: Former Smoker    Packs/day: 0.25    Years: 10.00    Pack years: 2.50    Types: Cigarettes  . Smokeless tobacco: Never Used  . Tobacco comment: qiut  when 78 years old  Substance and Sexual Activity  . Alcohol use: Yes    Alcohol/week: 1.0 - 2.0 standard drinks    Types: 1 - 2 Glasses of wine per week    Comment: 2 glasses of wine nightly x 5 years  . Drug use: No  . Sexual activity: Not Currently  Lifestyle  . Physical activity:    Days per week: Not on file    Minutes per session: Not on file  . Stress: Not on  file  Relationships  . Social connections:    Talks on phone: Not on file    Gets together: Not on file    Attends religious service: Not on file    Active member of club or organization: Not on file    Attends meetings of clubs or organizations: Not on file    Relationship status: Not on file  Other Topics Concern  . Not on file  Social History Narrative   Married, 4 children in the [place] area.  Grandchildren x 7.   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   No tobacco.  Alcohol 2 glasses wine/day.   Exercise: [employer], walking in park, stationary bike at home: 3 days per week for 1 hour.   Family History  Problem Relation Age of Onset  . Heart failure Mother   . Heart attack Mother        Deceased, 22  . Other Father        Deceased, 9  . Heart disease Brother   . Healthy Son   . Thyroid disease Daughter   . Cancer Daughter        ovarian  No Known Allergies Prior to Admission medications   Medication Sig Start Date End Date Taking? Authorizing Provider  sildenafil (VIAGRA) 100 MG tablet Take 1 tablet (100 mg total) by mouth daily as needed. 12/26/17  Yes McGowen, Adrian Blackwater, MD  fluticasone (CUTIVATE) 0.05 % cream Apply twice daily to the affected areas and occlude with vaseline Patient not taking: Reported on 03/04/2018 05/24/16   Tammi Sou, MD     Positive ROS: All other systems have been reviewed and were otherwise negative with the exception of those mentioned in the HPI and as above.  Physical Exam: General: Alert, no acute distress Cardiovascular: No pedal edema Respiratory: No cyanosis, no use of accessory musculature GI: No organomegaly, abdomen is soft and non-tender Skin: No lesions in the area of chief complaint Neurologic: Sensation intact distally Psychiatric: Patient is competent for consent with normal mood and affect Lymphatic: No axillary or cervical lymphadenopathy  MUSCULOSKELETAL: R shoulder: ROM 150, cuff weak, pain during  ROM  Assessment: djd right shoulder  Plan: Plan for Procedure(s): REVERSE SHOULDER ARTHROPLASTY  The risks benefits and alternatives were discussed with the patient including but not limited to the risks of nonoperative treatment, versus surgical intervention including infection, bleeding, nerve injury,  blood clots, cardiopulmonary complications, morbidity, mortality, among others, and they were willing to proceed.   We additionally specifically discussed risks of axillary nerve injury, infection, periprosthetic fracture, continued pain and longevity of implants prior to beginning procedure.    I additionally discussed with the patient that his function is painful but somewhat preserved and again gave him the option to opt out of surgery however he feels that he significantly limited and debilitated by this thus we feel that the patient would benefit of surgery.  He understands risk benefits.  Hiram Gash, MD  03/19/2018 8:12 AM

## 2018-03-20 MED ORDER — OXYCODONE HCL 5 MG PO TABS: ORAL_TABLET | ORAL | 0 refills | Status: AC

## 2018-03-20 MED ORDER — ACETAMINOPHEN 500 MG PO TABS: 1000.0000 mg | ORAL_TABLET | Freq: Three times a day (TID) | ORAL | 0 refills | Status: AC

## 2018-03-20 MED ORDER — MELOXICAM 7.5 MG PO TABS: 7.5000 mg | ORAL_TABLET | Freq: Every day | ORAL | 2 refills | Status: DC

## 2018-03-20 MED ORDER — ONDANSETRON HCL 4 MG PO TABS: 4.0000 mg | ORAL_TABLET | Freq: Three times a day (TID) | ORAL | 1 refills | Status: AC | PRN

## 2018-03-20 MED ORDER — OMEPRAZOLE 20 MG PO CPDR: 20.0000 mg | DELAYED_RELEASE_CAPSULE | Freq: Every day | ORAL | 0 refills | Status: DC

## 2018-03-20 NOTE — Anesthesia Postprocedure Evaluation (Signed)
Anesthesia Post Note  Patient: Paul Knight  Procedure(s) Performed: REVERSE SHOULDER ARTHROPLASTY (Right )     Patient location during evaluation: PACU Anesthesia Type: Regional and General Level of consciousness: awake and alert Pain management: pain level controlled Vital Signs Assessment: post-procedure vital signs reviewed and stable Respiratory status: spontaneous breathing, nonlabored ventilation, respiratory function stable and patient connected to nasal cannula oxygen Cardiovascular status: blood pressure returned to baseline and stable Postop Assessment: no apparent nausea or vomiting Anesthetic complications: no    Last Vitals:  Vitals:   03/20/18 0530 03/20/18 1015  BP: 101/61 120/64  Pulse: 73 73  Resp: 18 16  Temp: 36.9 C (!) 36.4 C  SpO2: 98% 98%    Last Pain:  Vitals:   03/20/18 1541  TempSrc:   PainSc: 0-No pain                 Capri Veals

## 2018-03-20 NOTE — Plan of Care (Signed)

## 2018-03-20 NOTE — Progress Notes (Signed)
At 1430, the pt was provided with d/c instructions. After discussing the pt's plan of care, the pt reported no further questions or concerns. The pt is currently waiting for his ride.

## 2018-03-20 NOTE — Evaluation (Signed)
Occupational Therapy Evaluation Patient Details Name: Paul Knight MRN: [id number] DOB: Mar 03, 1940 Today's Date: 03/20/2018    History of Present Illness s/p Right rotator cuff arthropathy   Clinical Impression   Pt admitted with the above diagnoses and presents with below problem list. Pt will benefit from continued acute OT to address the below listed deficits and maximize independence with basic ADLs. Pt independent with ADLs at baseline. Pt currently supervision - min guard with functional transfers (toilet, shower) and functional mobility; min A with UB/LB ADLs. Provided education on shoulder precautions, sling management and approved ROM exercises (elbow and distal). Pt ok for d/c from OT standpoint.     Follow Up Recommendations  Follow surgeon's recommendation for DC plan and follow-up therapies    Equipment Recommendations  None recommended by OT    Recommendations for Other Services       Precautions / Restrictions Precautions Precautions: Shoulder Shoulder Interventions: Shoulder sling/immobilizer;Shoulder abduction pillow;At all times;Off for dressing/bathing/exercises Precaution Booklet Issued: Yes (comment) Required Braces or Orthoses: Sling;Other Brace(abduction pillow) Restrictions Weight Bearing Restrictions: Yes RUE Weight Bearing: Non weight bearing      Mobility Bed Mobility Overal bed mobility: Modified Independent             General bed mobility comments: supine >sit  Transfers Overall transfer level: Needs assistance Equipment used: None Transfers: Sit to/from Stand Sit to Stand: Supervision         General transfer comment: to/from EOB and recliner    Balance Overall balance assessment: Mild deficits observed, not formally tested                                         ADL either performed or assessed with clinical judgement   ADL Overall ADL's : Needs assistance/impaired Eating/Feeding: Set up;Sitting    Grooming: Sitting;Set up;Minimal assistance   Upper Body Bathing: Minimal assistance;Sitting   Lower Body Bathing: Minimal assistance;Sit to/from stand   Upper Body Dressing : Minimal assistance;Sitting   Lower Body Dressing: Minimal assistance;Sit to/from stand   Toilet Transfer: Supervision/safety;Ambulation;Min guard   Toileting- Clothing Manipulation and Hygiene: Minimal assistance;Min guard;Sit to/from stand   Tub/ Shower Transfer: Walk-in shower;Min guard;Ambulation;Shower seat   Functional mobility during ADLs: Supervision/safety;Min guard General ADL Comments: Pt completed in room functional mobility, UB/LB dressing.      Vision         Perception     Praxis      Pertinent Vitals/Pain Pain Assessment: No/denies pain     Hand Dominance Right   Extremity/Trunk Assessment Upper Extremity Assessment Upper Extremity Assessment: RUE deficits/detail RUE Deficits / Details: s/p Right rotator cuff arthropathy; nerve block largely in effect. able to complete forearm/wrist/hand exercises  RUE: Unable to fully assess due to immobilization   Lower Extremity Assessment Lower Extremity Assessment: Overall WFL for tasks assessed   Cervical / Trunk Assessment Cervical / Trunk Assessment: Normal   Communication Communication Communication: No difficulties   Cognition Arousal/Alertness: Awake/alert Behavior During Therapy: WFL for tasks assessed/performed Overall Cognitive Status: Within Functional Limits for tasks assessed                                     General Comments       Exercises Exercises: Other exercises Other Exercises Other Exercises: demonstrated elbow ROM, pt able to  complete forearm/wrist/hand ROM   Shoulder Instructions      Home Living Family/patient expects to be discharged to:: Private residence Living Arrangements: Spouse/significant other Available Help at Discharge: Family;Available 24 hours/day;Other (Comment)(initial  24 hr assist from adult children and spouse) Type of Home: House       Home Layout: Two level;Able to live on main level with bedroom/bathroom   Alternate Level Stairs-Rails: Left Bathroom Shower/Tub: Occupational psychologist: Handicapped height     Home Equipment: Shower seat;Hand held shower head   Additional Comments: spouse is w/c bound, can provide limited assist (button shirt, etc); adult children also staying with pt the first 2 weeks      Prior Functioning/Environment Level of Independence: Independent        Comments: active, 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, enjoys walking outside        OT Problem List: Impaired balance (sitting and/or standing);Decreased knowledge of use of DME or AE;Decreased knowledge of precautions;Pain;Impaired UE functional use      OT Treatment/Interventions:      OT Goals(Current goals can be found in the care plan section) Acute Rehab OT Goals Patient Stated Goal: home and regain independence OT Goal Formulation: With patient Time For Goal Achievement: 03/27/18 Potential to Achieve Goals: Good ADL Goals Pt Will Perform Grooming: with modified independence;sitting;standing Pt Will Perform Upper Body Bathing: with modified independence;sitting Pt Will Perform Lower Body Bathing: with modified independence;sit to/from stand Pt Will Perform Upper Body Dressing: with modified independence;sitting Pt Will Transfer to Toilet: with modified independence;ambulating Pt Will Perform Toileting - Clothing Manipulation and hygiene: with modified independence;sit to/from stand Pt/caregiver will Perform Home Exercise Program: Right Upper extremity;With written HEP provided;Independently Additional ADL Goal #1: Pt will don/doff sling with at supervision level.  OT Frequency:     Barriers to D/C:            Co-evaluation              AM-PAC OT "6 Clicks" Daily Activity     Outcome Measure Help from another person eating meals?: None Help  from another person taking care of personal grooming?: A Little Help from another person toileting, which includes using toliet, bedpan, or urinal?: A Little Help from another person bathing (including washing, rinsing, drying)?: A Little Help from another person to put on and taking off regular upper body clothing?: A Little Help from another person to put on and taking off regular lower body clothing?: A Little 6 Click Score: 19   End of Session    Activity Tolerance: Patient tolerated treatment well Patient left: in chair;with call bell/phone within reach  OT Visit Diagnosis: Pain;Unsteadiness on feet (R26.81)                Time: 0630-1601 OT Time Calculation (min): 50 min Charges:  OT General Charges $OT Visit: 1 Visit OT Evaluation $OT Eval Low Complexity: 1 Low OT Treatments $Self Care/Home Management : 23-37 mins  Tyrone Schimke, OT Acute Rehabilitation Services Pager: [phone] Office: [phone]   Hortencia Pilar 03/20/2018, 10:28 AM

## 2018-03-21 NOTE — Discharge Summary (Signed)
Patient ID: Paul Knight MRN: [id number] DOB/AGE: May 04, 1940 78 y.o.  Admit date: 03/19/2018 Discharge date: 03/21/2018  Admission Diagnoses:R rotator cuff arthropathy  Discharge Diagnoses:  Active Problems:   Rotator cuff arthropathy, left   Rotator cuff tear arthropathy, right   Past Medical History:  Diagnosis Date  . Arthritis    shoulder  . Chronic right shoulder pain 12/12/2017   Steroid injection (Dr. Manus Gunning for reverse TSA as of 01/2018.  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 Erectile dysfunction   . Numbness in feet    ? Tarsal tunnel syndrome.  Ortho and neuro could not determine dx.       Procedures Performed: Right Reverse total shoulder with augmented baseplate  Discharged Condition: good  Hospital Course: Patient brought in as an outpatient for surgery.  Tolerated procedure well.  Was kept for monitoring overnight for pain control and medical monitoring postop and was found to be stable for DC home the morning after surgery.  Patient was instructed on specific activity restrictions and all questions were answered.  Consults: None  Significant Diagnostic Studies: No additional pertinent studies  Treatments: Surgery  Discharge Exam:  Dressing CDI and sling well fitting,  full and painless ROM throughout hand with DPC of 0.  Axillary nerve sensation/motor altered in setting of block and unable to be fully tested.  Distal motor and sensory altered in setting of block.   Disposition:   Discharge Instructions    Call MD for:  persistant nausea and vomiting   Complete by:  As directed    Call MD for:  redness, tenderness, or signs of infection (pain, swelling, redness, odor or green/yellow discharge around incision site)   Complete by:  As directed    Call MD for:  severe uncontrolled pain   Complete by:  As directed    Diet - low sodium heart healthy   Complete by:  As directed    Discharge instructions   Complete by:  As directed    Ophelia Charter MD, MPH  [facility]. [address] [phone] (tel)   [phone] (fax)   Crisp may leave the operative dressing in place until your follow-up appointment. KEEP THE INCISIONS CLEAN AND DRY. Use the Cryocuff, GameReady or Ice as often as possible for the first 3-4 days, then as needed for pain relief.  You may shower on Post-Op Day #2. The dressing is water resistant but do not scrub it as it may start to peel up.  You may remove the sling for showering, but keep a water resistant pillow under the arm to keep both the elbow and shoulder away from the body (mimicking the abduction sling). Gently pat the area dry. Do not soak the shoulder in water. Do not go swimming in the pool or ocean until your sutures are removed.  EXERCISES Wear the sling at all times except when doing your exercises. You may remove the sling for showering, but keep the arm across the chest or in a secondary sling.   Accidental/Purposeful External Rotation and shoulder flexion (reaching behind you) is to be avoided at all costs for the first month. Please perform the exercises:   Elbow / Hand / Wrist  Range of Motion Exercises POST-OP A multi-modal approach will be used to treat your pain. Oxycodone - This is a strong narcotic, to be used only on an "as needed" basis for pain.  Meloxicam- An anti-inflammatory medication Acetaminophen - A non-narcotic pain medicine.  Use 1000mg  three times a day for the first 14 days after surgery If you have any adverse effects with the medications, please call our office.  FOLLOW-UP If you develop a Fever (>101.5), Redness or Drainage from the surgical incision site, please call our office to arrange for an evaluation. Please call the office to schedule a follow-up appointment for a wound check, 7-10 days post-operatively.    IF YOU HAVE ANY QUESTIONS, PLEASE FEEL FREE TO CALL OUR  OFFICE.   HELPFUL INFORMATION  Your arm will be in a sling following surgery. You will be in this sling for the next 3-4 weeks.  I will let you know the exact duration at your follow-up visit.  You may be more comfortable sleeping in a semi-seated position the first few nights following surgery.  Keep a pillow propped under the elbow and forearm for comfort.  If you have a recliner type of chair it might be beneficial.  If not that is fine too, but it would be helpful to sleep propped up with pillows behind your operated shoulder as well under your elbow and forearm.  This will reduce pulling on the suture lines.  We suggest you use the pain medication the first night prior to going to bed, in order to ease any pain when the anesthesia wears off. You should avoid taking pain medications on an empty stomach as it will make you nauseous.  Do not drink alcoholic beverages or take illicit drugs when taking pain medications.  In most states it is against the law to drive while your arm is in a sling. And certainly against the law to drive while taking narcotics.  You may return to work/school in the next couple of days when you feel up to it. Desk work and typing in the sling is fine.  When dressing, put your operative arm in the sleeve first.  When getting undressed, take your operative arm out last.  Loose fitting, button-down shirts are recommended.  Pain medication may make you constipated.  Below are a few solutions to try in this order: Decrease the amount of pain medication if you aren't having pain. Drink lots of decaffeinated fluids. Drink prune juice and/or each dried prunes  If the first 3 don't work start with additional solutions Take Colace - an over-the-counter stool softener Take Senokot - an over-the-counter laxative Take Miralax - a stronger over-the-counter laxative   Increase activity slowly   Complete by:  As directed      Allergies as of 03/20/2018   No Known  Allergies     Medication List    TAKE these medications   acetaminophen 500 MG tablet Commonly known as:  TYLENOL Take 2 tablets (1,000 mg total) by mouth every 8 (eight) hours for 14 days.   fluticasone 0.05 % cream Commonly known as:  CUTIVATE Apply twice daily to the affected areas and occlude with vaseline   meloxicam 7.5 MG tablet Commonly known as:  Mobic Take 1 tablet (7.5 mg total) by mouth daily.   omeprazole 20 MG capsule Commonly known as:  PRILOSEC Take 1 capsule (20 mg total) by mouth daily for 14 days.   ondansetron 4 MG tablet Commonly known as:  Zofran Take 1 tablet (4 mg total) by mouth every 8 (eight) hours as needed for up to 7 days for nausea or vomiting.   oxyCODONE 5 MG immediate release tablet Commonly known as:  Oxy IR/ROXICODONE Take 1-2 pills every 6 hrs as needed for pain, no more than 6 per day   sildenafil 100 MG tablet Commonly known as:  VIAGRA Take 1 tablet (100 mg total) by mouth daily as needed.

## 2018-03-27 ENCOUNTER — Encounter (HOSPITAL_COMMUNITY): Payer: Self-pay | Admitting: Orthopaedic Surgery

## 2018-03-28 DIAGNOSIS — M19011 Primary osteoarthritis, right shoulder: Secondary | ICD-10-CM | POA: Diagnosis not present

## 2018-03-28 MED ORDER — BUPIVACAINE LIPOSOME 1.3 % IJ SUSP
INTRAMUSCULAR | Status: DC | PRN
  Administered 2018-03-19: 133 mg via PERINEURAL

## 2018-03-28 MED ORDER — BUPIVACAINE-EPINEPHRINE (PF) 0.5% -1:200000 IJ SOLN
INTRAMUSCULAR | Status: DC | PRN
  Administered 2018-03-19: 15 mL via PERINEURAL

## 2018-03-28 NOTE — Addendum Note (Signed)
Addendum  created 03/28/18 1601 by Oleta Mouse, MD   Child order released for a procedure order, Clinical Note Signed, Intraprocedure Blocks edited, Intraprocedure Meds edited

## 2018-03-28 NOTE — Anesthesia Procedure Notes (Signed)
Anesthesia Regional Block: Interscalene brachial plexus block   Pre-Anesthetic Checklist: ,, timeout performed, Correct Patient, Correct Site, Correct Laterality, Correct Procedure, Correct Position, site marked, Risks and benefits discussed,  Surgical consent,  Pre-op evaluation,  At surgeon's request and post-op pain management  Laterality: Right and Upper  Prep: chloraprep       Needles:  Injection technique: Single-shot     Needle Length: 9cm  Needle Gauge: 22     Additional Needles: Arrow StimuQuik ECHO Echogenic Stimulating PNB Needle  Procedures:,,,, ultrasound used (permanent image in chart),,,,  Narrative:  Start time: 03/19/2018 8:14 AM End time: 03/19/2018 8:19 AM Injection made incrementally with aspirations every 5 mL.  Performed by: Personally  Anesthesiologist: Oleta Mouse, MD

## 2018-04-03 DIAGNOSIS — M19011 Primary osteoarthritis, right shoulder: Secondary | ICD-10-CM | POA: Diagnosis not present

## 2018-04-09 ENCOUNTER — Encounter: Payer: Self-pay | Admitting: Family Medicine

## 2018-04-22 DIAGNOSIS — M19011 Primary osteoarthritis, right shoulder: Secondary | ICD-10-CM | POA: Diagnosis not present

## 2018-06-24 DIAGNOSIS — M19011 Primary osteoarthritis, right shoulder: Secondary | ICD-10-CM | POA: Diagnosis not present

## 2018-09-26 ENCOUNTER — Ambulatory Visit (INDEPENDENT_AMBULATORY_CARE_PROVIDER_SITE_OTHER): Payer: Medicare HMO

## 2018-09-26 ENCOUNTER — Other Ambulatory Visit: Payer: Self-pay

## 2018-09-26 DIAGNOSIS — Z23 Encounter for immunization: Secondary | ICD-10-CM

## 2018-10-21 ENCOUNTER — Telehealth: Payer: Self-pay | Admitting: Family Medicine

## 2018-10-21 NOTE — Telephone Encounter (Signed)
Patient requesting dermatologist referral for spots on his arm. Dr. 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 Patient reports spots on arm look similar to what was on his face.  Patient could not remember name of dermatologist.  Please contact patient [phone]

## 2018-10-21 NOTE — Telephone Encounter (Signed)
Pls give pt Dr. Onalee Hua office contact info and he can call and make himself an appointment b/c he is already established there.-thx

## 2018-10-21 NOTE — Telephone Encounter (Signed)
Spoke with Rod Holler, pt's wife and an appt has already been made with Dr.Tafeen for 11/9

## 2018-10-21 NOTE — Telephone Encounter (Signed)
Patient's last OV was 05/28/17, he was referred to [facility]. Most recent referral was 04/22/17 for rash of face and before that 05/21/16 for facial lesion.  Please advise, thanks.

## 2018-12-12 ENCOUNTER — Encounter: Payer: Self-pay | Admitting: Family Medicine

## 2018-12-12 ENCOUNTER — Ambulatory Visit (INDEPENDENT_AMBULATORY_CARE_PROVIDER_SITE_OTHER): Payer: Medicare HMO | Admitting: Family Medicine

## 2018-12-12 ENCOUNTER — Other Ambulatory Visit: Payer: Self-pay

## 2018-12-12 VITALS — BP 126/81 | HR 85 | Temp 98.1°F | Resp 16 | Ht 69.0 in | Wt 197.4 lb

## 2018-12-12 DIAGNOSIS — Z1211 Encounter for screening for malignant neoplasm of colon: Secondary | ICD-10-CM | POA: Diagnosis not present

## 2018-12-12 DIAGNOSIS — Z23 Encounter for immunization: Secondary | ICD-10-CM

## 2018-12-12 DIAGNOSIS — M25552 Pain in left hip: Secondary | ICD-10-CM

## 2018-12-12 DIAGNOSIS — M7062 Trochanteric bursitis, left hip: Secondary | ICD-10-CM | POA: Diagnosis not present

## 2018-12-12 DIAGNOSIS — Z0000 Encounter for general adult medical examination without abnormal findings: Secondary | ICD-10-CM | POA: Diagnosis not present

## 2018-12-12 MED ORDER — ZOSTER VAC RECOMB ADJUVANTED 50 MCG/0.5ML IM SUSR: 0.5000 mL | Freq: Once | INTRAMUSCULAR | 0 refills | Status: AC

## 2018-12-12 NOTE — Progress Notes (Signed)
Office Note 12/12/2018  CC:  Chief Complaint  Patient presents with  . Annual Exam    pt is fasting    HPI:  Paul Knight is a 78 y.o. White male who is here for annual health maintenance exam.  About 3 yr hx of pain in lateral L hip region, triggered by increased hip flex/ext like when doing stationary bike, also consistently after laying on it when sleeping. No pain with simply wt bearing or regular ambulation. No hx of injury to hip.  No leg weakness, no radiation of pain down leg.  No paresthesias.  No LB pain.   Past Medical History:  Diagnosis Date  . Arthritis    shoulder  . Chronic right shoulder pain 12/12/2017   Reverse TSA with augmentation 03/19/2018  . 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 Erectile dysfunction   . Numbness in feet    ? Tarsal tunnel syndrome.  Ortho and neuro could not determine dx.    . Squamous cell carcinoma of skin 06/11/2016   right cheek - CX3 + 5FU    Past Surgical History:  Procedure Laterality Date  . APPENDECTOMY  1970s  . COLONOSCOPY  01/08/2009   Normal; recall 10 yrs  . REVERSE SHOULDER ARTHROPLASTY Right 03/19/2018   Procedure: REVERSE SHOULDER ARTHROPLASTY;  Surgeon: Hiram Gash, MD;  Location: [facility];  Service: Orthopedics;  Laterality: Right;  with exparel  . TONSILLECTOMY  as a child    Family History  Problem Relation Age of Onset  . Heart failure Mother   . Heart attack Mother        Deceased, 38  . Other Father        Deceased, 82  . Heart disease Brother   . Healthy Son   . Thyroid disease Daughter   . Cancer Daughter        ovarian    Social History   Socioeconomic History  . Marital status: Married    Spouse name: Not on file  . Number of children: 4  . Years of education: Not on file  . Highest education level: Not on file  Occupational History  . Occupation: Retired  Scientific laboratory technician  . Financial resource strain: Not on file  . Food insecurity    Worry: Not on file    Inability: Not on file  .  Transportation needs    Medical: Not on file    Non-medical: Not on file  Tobacco Use  . Smoking status: Former Smoker    Packs/day: 0.25    Years: 10.00    Pack years: 2.50    Types: Cigarettes  . Smokeless tobacco: Never Used  . Tobacco comment: qiut  when 78 years old  Substance and Sexual Activity  . Alcohol use: Yes    Alcohol/week: 1.0 - 2.0 standard drinks    Types: 1 - 2 Glasses of wine per week    Comment: 2 glasses of wine nightly x 5 years  . Drug use: No  . Sexual activity: Not Currently  Lifestyle  . Physical activity    Days per week: Not on file    Minutes per session: Not on file  . Stress: Not on file  Relationships  . Social Herbalist on phone: Not on file    Gets together: Not on file    Attends religious service: Not on file    Active member of club or organization: Not on file    Attends  meetings of clubs or organizations: Not on file    Relationship status: Not on file  . Intimate partner violence    Fear of current or ex partner: Not on file    Emotionally abused: Not on file    Physically abused: Not on file    Forced sexual activity: Not on file  Other Topics Concern  . Not on file  Social History Narrative   Married, 4 children in the [place] area.  Grandchildren x 7.   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   No tobacco.  Alcohol 2 glasses wine/day.   Exercise: [employer], walking in park, stationary bike at home: 3 days per week for 1 hour.    Outpatient Medications Prior to Visit  Medication Sig Dispense Refill  . sildenafil (VIAGRA) 100 MG tablet Take 1 tablet (100 mg total) by mouth daily as needed. 25 tablet 3  . fluticasone (CUTIVATE) 0.05 % cream Apply twice daily to the affected areas and occlude with vaseline (Patient not taking: Reported on 03/04/2018) 30 g 1  . meloxicam (MOBIC) 7.5 MG tablet Take 1 tablet (7.5 mg total) by mouth daily. (Patient not taking: Reported on 12/12/2018) 30 tablet 2  . omeprazole (PRILOSEC)  20 MG capsule Take 1 capsule (20 mg total) by mouth daily for 14 days. (Patient not taking: Reported on 12/12/2018) 14 capsule 0   No facility-administered medications prior to visit.     No Known Allergies  ROS Review of Systems  Constitutional: Negative for appetite change, chills, fatigue and fever.  HENT: Negative for congestion, dental problem, ear pain and sore throat.   Eyes: Negative for discharge, redness and visual disturbance.  Respiratory: Negative for cough, chest tightness, shortness of breath and wheezing.   Cardiovascular: Negative for chest pain, palpitations and leg swelling.  Gastrointestinal: Negative for abdominal pain, blood in stool, diarrhea, nausea and vomiting.  Genitourinary: Negative for difficulty urinating, dysuria, flank pain, frequency, hematuria and urgency.  Musculoskeletal: Positive for arthralgias (left hip-see hpi). Negative for back pain, joint swelling, myalgias and neck stiffness.  Skin: Negative for pallor and rash.  Neurological: Negative for dizziness, speech difficulty, weakness and headaches.  Hematological: Negative for adenopathy. Does not bruise/bleed easily.  Psychiatric/Behavioral: Negative for confusion and sleep disturbance. The patient is not nervous/anxious.     PE; Blood pressure 126/81, pulse 85, temperature 98.1 F (36.7 C), temperature source Temporal, resp. rate 16, height 5\' 9"  (1.753 m), weight 197 lb 6.4 oz (89.5 kg), SpO2 99 %. Body mass index is 29.15 kg/m.  Gen: Alert, well appearing.  Patient is oriented to person, place, time, and situation. AFFECT: pleasant, lucid thought and speech. ENT: Ears: EACs clear, normal epithelium.  TMs with good light reflex and landmarks bilaterally.  Eyes: no injection, icteris, swelling, or exudate.  EOMI, PERRLA. Nose: no drainage or turbinate edema/swelling.  No injection or focal lesion.  Mouth: lips without lesion/swelling.  Oral mucosa pink and moist.  Dentition intact and without  obvious caries or gingival swelling.  Oropharynx without erythema, exudate, or swelling.  Neck: supple/nontender.  No LAD, mass, or TM.  Carotid pulses 2+ bilaterally, without bruits. CV: RRR, no m/r/g.   LUNGS: CTA bilat, nonlabored resps, good aeration in all lung fields. ABD: soft, NT, ND, BS normal.  No hepatospenomegaly or mass.  No bruits. EXT: no clubbing, cyanosis, or edema.  Musculoskeletal: no joint swelling, erythema, warmth, or tenderness except focal TTP over greater troch on L.   ROM of all joints intact.  Skin - no sores or suspicious lesions or rashes or color changes   Pertinent labs:  Lab Results  Component Value Date   TSH 1.20 05/20/2015   Lab Results  Component Value Date   WBC 6.5 03/12/2018   HGB 14.4 03/12/2018   HCT 45.7 03/12/2018   MCV 103.4 (H) 03/12/2018   PLT 299 03/12/2018   Lab Results  Component Value Date   CREATININE 0.90 05/28/2017   BUN 24 (H) 05/28/2017   NA 139 05/28/2017   K 4.1 05/28/2017   CL 106 05/28/2017   CO2 25 05/28/2017   Lab Results  Component Value Date   ALT 25 05/28/2017   AST 29 05/28/2017   ALKPHOS 30 (L) 05/28/2017   BILITOT 1.0 05/28/2017   Lab Results  Component Value Date   CHOL 106 05/28/2017   Lab Results  Component Value Date   HDL 57.10 05/28/2017   Lab Results  Component Value Date   LDLCALC 39 05/28/2017   Lab Results  Component Value Date   TRIG 51.0 05/28/2017   Lab Results  Component Value Date   CHOLHDL 2 05/28/2017   Lab Results  Component Value Date   PSA 0.44 02/27/2013   PSA 0.43 08/09/2010   PSA 0.39 08/05/2009    ASSESSMENT AND PLAN:   Health maintenance exam: Reviewed age and gender appropriate health maintenance issues (prudent diet, regular exercise, health risks of tobacco and excessive alcohol, use of seatbelts, fire alarms in home, use of sunscreen).  Also reviewed age and gender appropriate health screening as well as vaccine recommendations. Vaccines: All UTD.   Shingrix discussed->sent rx to pharmacy today. Labs: CBC, CMET, FLP. Prostate ca screening: no further screening due to age. Colon ca screening: recall 01/2019--he declines this.  Left hip pain: suspect trochanteric bursitis. Pt concerned about osteoarthritis,  X-ray reasonable to get so I ordered this today. Referred pt to Dr. Noemi Chapel for further eval/potential steroid injection in bursa.  An After Visit Summary was printed and given to the patient.  FOLLOW UP:  Return in about 1 year (around 12/12/2019) for annual CPE (fasting).  Signed:  Crissie Sickles, MD           12/12/2018

## 2018-12-12 NOTE — Patient Instructions (Signed)

## 2018-12-13 LAB — LIPID PANEL
Cholesterol: 125 mg/dL (ref <200)
HDL: 54 mg/dL (ref >=40)
LDL Cholesterol (Calc): 58 mg/dL (calc)
Non-HDL Cholesterol (Calc): 71 mg/dL (calc) (ref <130)
Total CHOL/HDL Ratio: 2.3 (calc) (ref <5.0)
Triglycerides: 52 mg/dL (ref <150)

## 2018-12-13 LAB — CBC WITH DIFFERENTIAL/PLATELET
Absolute Monocytes: 882 cells/uL (ref 200–950)
Basophils Absolute: 68 cells/uL (ref 0–200)
Basophils Relative: 0.9 %
Eosinophils Absolute: 129 cells/uL (ref 15–500)
Eosinophils Relative: 1.7 %
HCT: 42.6 % (ref 38.5–50.0)
Hemoglobin: 14.8 g/dL (ref 13.2–17.1)
Lymphs Abs: 3162 cells/uL (ref 850–3900)
MCH: 34.3 pg — ABNORMAL HIGH (ref 27.0–33.0)
MCHC: 34.7 g/dL (ref 32.0–36.0)
MCV: 98.8 fL (ref 80.0–100.0)
MPV: 10.2 fL (ref 7.5–12.5)
Monocytes Relative: 11.6 %
Neutro Abs: 3359 cells/uL (ref 1500–7800)
Neutrophils Relative %: 44.2 %
Platelets: 311 10*3/uL (ref 140–400)
RBC: 4.31 10*6/uL (ref 4.20–5.80)
RDW: 12.6 % (ref 11.0–15.0)
Total Lymphocyte: 41.6 %
WBC: 7.6 10*3/uL (ref 3.8–10.8)

## 2018-12-13 LAB — COMPREHENSIVE METABOLIC PANEL
AG Ratio: 1.4 (calc) (ref 1.0–2.5)
ALT: 21 U/L (ref 9–46)
AST: 27 U/L (ref 10–35)
Albumin: 3.9 g/dL (ref 3.6–5.1)
Alkaline phosphatase (APISO): 31 U/L — ABNORMAL LOW (ref 35–144)
BUN: 20 mg/dL (ref 7–25)
CO2: 25 mmol/L (ref 20–32)
Calcium: 9 mg/dL (ref 8.6–10.3)
Chloride: 106 mmol/L (ref 98–110)
Creat: 0.99 mg/dL (ref 0.70–1.18)
Globulin: 2.8 g/dL (calc) (ref 1.9–3.7)
Glucose, Bld: 91 mg/dL (ref 65–99)
Potassium: 4.6 mmol/L (ref 3.5–5.3)
Sodium: 140 mmol/L (ref 135–146)
Total Bilirubin: 1.2 mg/dL (ref 0.2–1.2)
Total Protein: 6.7 g/dL (ref 6.1–8.1)

## 2019-03-24 ENCOUNTER — Other Ambulatory Visit: Payer: Self-pay

## 2019-03-24 ENCOUNTER — Ambulatory Visit (HOSPITAL_BASED_OUTPATIENT_CLINIC_OR_DEPARTMENT_OTHER)
Admission: RE | Admit: 2019-03-24 | Discharge: 2019-03-24 | Disposition: A | Discharge status: Home / Self Care | Payer: Medicare HMO | Source: Ambulatory Visit | Attending: Family Medicine | Admitting: Family Medicine

## 2019-03-24 DIAGNOSIS — M25552 Pain in left hip: Secondary | ICD-10-CM | POA: Insufficient documentation

## 2019-05-21 ENCOUNTER — Other Ambulatory Visit: Payer: Self-pay

## 2019-05-21 MED ORDER — SILDENAFIL CITRATE 100 MG PO TABS: 100.0000 mg | ORAL_TABLET | Freq: Every day | ORAL | 3 refills | Status: DC | PRN

## 2019-05-21 NOTE — Telephone Encounter (Addendum)
RF request for Sildenafil LOV:12/12/18 Next ov: advised to f/u 1 year Last written:12/26/17(25,3)  Medication pending, please advise.

## 2019-05-21 NOTE — Telephone Encounter (Signed)
Patient calling in needing a new Rx because old one is now expired   sildenafil (VIAGRA) 100 MG tablet  Patient is asking for 30 tablets 100 MG    [place], [place] [address]   Patient is asking for paper copy of Rx to take to Pharmacy, and would like to come pick it up today also

## 2019-05-21 NOTE — Addendum Note (Signed)
Addended by: Deveron Furlong D on: 05/21/2019 09:36 AM   Modules accepted: Orders

## 2019-05-21 NOTE — Telephone Encounter (Signed)
Written prescription printed and signed. Pt contacted and notified. Will be up front for pick up

## 2019-07-03 ENCOUNTER — Telehealth: Payer: Self-pay

## 2019-07-03 DIAGNOSIS — L989 Disorder of the skin and subcutaneous tissue, unspecified: Secondary | ICD-10-CM

## 2019-07-03 DIAGNOSIS — Z85828 Personal history of other malignant neoplasm of skin: Secondary | ICD-10-CM

## 2019-07-03 NOTE — Telephone Encounter (Signed)
Patient called in needing another referral made to his dermatologist its been 3 years since last seen and a new referral is needed. He think his pre cancer skin growth is coming   [facility] Dr. Marge Duncans   Please call and advise

## 2019-07-03 NOTE — Telephone Encounter (Signed)
Last referral done 04/22/17 for facial rash but no appointment was set up.   Please advise on referral, thanks.

## 2019-07-06 NOTE — Telephone Encounter (Addendum)
Referral done, patient was made aware.

## 2019-07-06 NOTE — Telephone Encounter (Signed)
OK, derm referral ordered. 

## 2019-07-12 ENCOUNTER — Emergency Department (HOSPITAL_BASED_OUTPATIENT_CLINIC_OR_DEPARTMENT_OTHER): Payer: Medicare HMO

## 2019-07-12 ENCOUNTER — Encounter (HOSPITAL_BASED_OUTPATIENT_CLINIC_OR_DEPARTMENT_OTHER): Payer: Self-pay | Admitting: Emergency Medicine

## 2019-07-12 ENCOUNTER — Other Ambulatory Visit: Payer: Self-pay

## 2019-07-12 ENCOUNTER — Emergency Department (HOSPITAL_BASED_OUTPATIENT_CLINIC_OR_DEPARTMENT_OTHER)
Admission: EM | Admit: 2019-07-12 | Discharge: 2019-07-12 | Disposition: A | Discharge status: Home / Self Care | Payer: Medicare HMO | Attending: Emergency Medicine | Admitting: Emergency Medicine

## 2019-07-12 DIAGNOSIS — Z96611 Presence of right artificial shoulder joint: Secondary | ICD-10-CM | POA: Insufficient documentation

## 2019-07-12 DIAGNOSIS — R2241 Localized swelling, mass and lump, right lower limb: Secondary | ICD-10-CM | POA: Diagnosis present

## 2019-07-12 DIAGNOSIS — R5383 Other fatigue: Secondary | ICD-10-CM | POA: Diagnosis not present

## 2019-07-12 DIAGNOSIS — Z87891 Personal history of nicotine dependence: Secondary | ICD-10-CM | POA: Insufficient documentation

## 2019-07-12 DIAGNOSIS — R600 Localized edema: Secondary | ICD-10-CM | POA: Diagnosis not present

## 2019-07-12 DIAGNOSIS — R21 Rash and other nonspecific skin eruption: Secondary | ICD-10-CM | POA: Insufficient documentation

## 2019-07-12 DIAGNOSIS — D692 Other nonthrombocytopenic purpura: Secondary | ICD-10-CM | POA: Insufficient documentation

## 2019-07-12 LAB — CBC WITH DIFFERENTIAL/PLATELET
Abs Immature Granulocytes: 0.05 10*3/uL (ref 0.00–0.07)
Basophils Absolute: 0.1 10*3/uL (ref 0.0–0.1)
Basophils Relative: 1 %
Eosinophils Absolute: 0.2 10*3/uL (ref 0.0–0.5)
Eosinophils Relative: 2 %
HCT: 41.1 % (ref 39.0–52.0)
Hemoglobin: 13.9 g/dL (ref 13.0–17.0)
Immature Granulocytes: 1 %
Lymphocytes Relative: 36 %
Lymphs Abs: 3.4 10*3/uL (ref 0.7–4.0)
MCH: 33.5 pg (ref 26.0–34.0)
MCHC: 33.8 g/dL (ref 30.0–36.0)
MCV: 99 fL (ref 80.0–100.0)
Monocytes Absolute: 1.1 10*3/uL — ABNORMAL HIGH (ref 0.1–1.0)
Monocytes Relative: 12 %
Neutro Abs: 4.7 10*3/uL (ref 1.7–7.7)
Neutrophils Relative %: 48 %
Platelets: 296 10*3/uL (ref 150–400)
RBC: 4.15 MIL/uL — ABNORMAL LOW (ref 4.22–5.81)
RDW: 13.9 % (ref 11.5–15.5)
WBC: 9.5 10*3/uL (ref 4.0–10.5)
nRBC: 0 % (ref 0.0–0.2)

## 2019-07-12 LAB — BASIC METABOLIC PANEL
Anion gap: 10 (ref 5–15)
BUN: 28 mg/dL — ABNORMAL HIGH (ref 8–23)
CO2: 21 mmol/L — ABNORMAL LOW (ref 22–32)
Calcium: 8.5 mg/dL — ABNORMAL LOW (ref 8.9–10.3)
Chloride: 105 mmol/L (ref 98–111)
Creatinine, Ser: 0.94 mg/dL (ref 0.61–1.24)
GFR calc Af Amer: >60 mL/min (ref >60)
GFR calc non Af Amer: >60 mL/min (ref >60)
Glucose, Bld: 144 mg/dL — ABNORMAL HIGH (ref 70–99)
Potassium: 3.8 mmol/L (ref 3.5–5.1)
Sodium: 136 mmol/L (ref 135–145)

## 2019-07-12 MED ORDER — CEPHALEXIN 500 MG PO CAPS
500.0000 mg | ORAL_CAPSULE | Freq: Four times a day (QID) | ORAL | 0 refills | Status: DC
Start: 2019-07-12 — End: 2019-09-09

## 2019-07-12 MED ORDER — CEPHALEXIN 250 MG PO CAPS
500.0000 mg | ORAL_CAPSULE | Freq: Once | ORAL | Status: AC
  Administered 2019-07-12: 500 mg via ORAL
  Filled 2019-07-12: qty 2

## 2019-07-12 NOTE — Discharge Instructions (Signed)
Please read and follow all provided instructions.  Your diagnoses today include:  1. Rash   2. Purpura ([place])     Tests performed today include:  Blood counts and electrolytes - shows normal platelet count, normal white blood cell count  Ultrasound - did not show a blood clot in the leg  Vital signs. See below for your results today.   Medications prescribed:   Keflex (cephalexin) - antibiotic  You have been prescribed an antibiotic medicine: take the entire course of medicine even if you are feeling better. Stopping early can cause the antibiotic not to work.  Take any prescribed medications only as directed.  Home care instructions:  Follow any educational materials contained in this packet.  BE VERY CAREFUL not to take multiple medicines containing Tylenol (also called acetaminophen). Doing so can lead to an overdose which can damage your liver and cause liver failure and possibly death.   Follow-up instructions: Please follow-up with your primary care provider or dermatologist in the next 3 days for further evaluation of your symptoms.   Return instructions:   Please return to the Emergency Department if you experience worsening symptoms.   Return with fever, worsening pain or swelling in the leg  Please return if you have any other emergent concerns.  Additional Information:  Your vital signs today were: BP (!) 160/84 (BP Location: Right Arm)   Pulse 95   Temp 98.2 F (36.8 C) (Oral)   Resp 18   Ht 5\' 11"  (1.803 m)   Wt 90.7 kg   SpO2 99%   BMI 27.88 kg/m  If your blood pressure (BP) was elevated above 135/85 this visit, please have this repeated by your doctor within one month. --------------

## 2019-07-12 NOTE — ED Provider Notes (Signed)
[facility] EMERGENCY DEPARTMENT Provider Note   CSN: [id number] Arrival date & time: 07/12/19  1920     History Chief Complaint  Patient presents with  . Rash    Paul Knight is a 79 y.o. male.  Patient presents to the emergency department today with complaint of right lower extremity swelling and rash.  3 days ago patient was working outside and had heat exhaustion.  Wife reported low-grade fever and elevated heart rate in the evening until he cooled off.  Patient states that he felt tired and weak the next day with some mild nausea.  He developed a red, mildly tender rash with warmth of the skin on the right lower leg.  This area has not been overly painful or itchy.  He has applied calamine lotion.  Patient felt generally better yesterday but the rash persisted and swelling was worse today prompting emergency department visit.  No chest pain or shortness of breath.  No history of blood clots.  No tick bites.  He denies easy bruising, bleeding, blood in stool or urine.        Past Medical History:  Diagnosis Date  . Arthritis    shoulder  . Chronic right shoulder pain 12/12/2017   Reverse TSA with augmentation 03/19/2018  . 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 Erectile dysfunction   . Numbness in feet    ? Tarsal tunnel syndrome.  Ortho and neuro could not determine dx.    . Squamous cell carcinoma of skin 06/11/2016   right cheek - CX3 + 5FU    Patient Active Problem List   Diagnosis Date Noted  . Rotator cuff arthropathy, left 03/19/2018  . 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 Health maintenance examination 05/20/2014  . OBESITY 08/05/2009  . ERECTILE DYSFUNCTION 02/06/2008    Past Surgical History:  Procedure Laterality Date  . APPENDECTOMY  1970s  . COLONOSCOPY  01/08/2009   Normal; recall 10 yrs  . REVERSE SHOULDER ARTHROPLASTY Right 03/19/2018   Procedure: REVERSE SHOULDER  ARTHROPLASTY;  Surgeon: Hiram Gash, MD;  Location: [facility];  Service: Orthopedics;  Laterality: Right;  with exparel  . TONSILLECTOMY  as a child       Family History  Problem Relation Age of Onset  . Heart failure Mother   . Heart attack Mother        Deceased, 67  . Other Father        Deceased, 25  . Heart disease Brother   . Healthy Son   . Thyroid disease Daughter   . Cancer Daughter        ovarian    Social History   Tobacco Use  . Smoking status: Former Smoker    Packs/day: 0.25    Years: 10.00    Pack years: 2.50    Types: Cigarettes  . Smokeless tobacco: Never Used  . Tobacco comment: qiut  when 79 years old  Vaping Use  . Vaping Use: Never used  Substance Use Topics  . Alcohol use: Yes    Alcohol/week: 1.0 - 2.0 standard drink    Types: 1 - 2 Glasses of wine per week    Comment: 2 glasses of wine nightly x 5 years  . Drug use: No    Home Medications Prior to Admission medications   Medication Sig Start Date End Date Taking? Authorizing Provider  sildenafil (VIAGRA) 100 MG tablet Take 1 tablet (100  mg total) by mouth daily as needed. 05/21/19   McGowen, Adrian [place], MD    Allergies    Patient has no known allergies.  Review of Systems   Review of Systems  Constitutional: Positive for fatigue. Negative for chills and fever (resolved).  HENT: Negative for facial swelling and trouble swallowing.   Eyes: Negative for redness.  Respiratory: Negative for shortness of breath, wheezing and stridor.   Cardiovascular: Positive for leg swelling. Negative for chest pain.  Gastrointestinal: Negative for nausea and vomiting.  Musculoskeletal: Negative for myalgias.  Skin: Positive for rash. Negative for color change.  Neurological: Negative for light-headedness.  Psychiatric/Behavioral: Negative for confusion.    Physical Exam Updated Vital Signs BP (!) 160/84 (BP Location: Right Arm)   Pulse 95   Temp 98.2 F (36.8 C) (Oral)   Resp 18   Ht 5\' 11"  (1.803  m)   Wt 90.7 kg   SpO2 99%   BMI 27.88 kg/m   Physical Exam Vitals and nursing note reviewed.  Constitutional:      Appearance: He is well-developed.  HENT:     Head: Normocephalic and atraumatic.  Eyes:     General:        Right eye: No discharge.        Left eye: No discharge.     Conjunctiva/sclera: Conjunctivae normal.  Cardiovascular:     Rate and Rhythm: Normal rate and regular rhythm.     Heart sounds: Normal heart sounds.  Pulmonary:     Effort: Pulmonary effort is normal.     Breath sounds: Normal breath sounds.  Abdominal:     Palpations: Abdomen is soft.     Tenderness: There is no abdominal tenderness.  Musculoskeletal:     Cervical back: Normal range of motion and neck supple.  Skin:    General: Skin is warm and dry.     Findings: Rash present.     Comments: See picture. Right shin and calf with petechial/purpuric rash, irregular, non-blanchable. Mild associated edema of ankle and distal lower leg.   Neurological:     Mental Status: He is alert.     Sensory: No sensory deficit.           ED Results / Procedures / Treatments   Labs (all labs ordered are listed, but only abnormal results are displayed) Labs Reviewed  CBC WITH DIFFERENTIAL/PLATELET - Abnormal; Notable for the following components:      Result Value   RBC 4.15 (*)    Monocytes Absolute 1.1 (*)    All other components within normal limits  BASIC METABOLIC PANEL - Abnormal; Notable for the following components:   CO2 21 (*)    Glucose, Bld 144 (*)    BUN 28 (*)    Calcium 8.5 (*)    All other components within normal limits    EKG None  Radiology US Venous Img Lower Right (DVT Study)  Result Date: 07/12/2019 CLINICAL DATA:  RIGHT ankle is red, with rash. Swelling. Symptoms for 5 days. EXAM: RIGHT LOWER EXTREMITY VENOUS DOPPLER ULTRASOUND TECHNIQUE: Gray-scale sonography with compression, as well as color and duplex ultrasound, were performed to evaluate the deep venous  system(s) from the level of the common femoral vein through the popliteal and proximal calf veins. COMPARISON:  None. FINDINGS: VENOUS Normal compressibility of the common femoral, superficial femoral, and popliteal veins, as well as the visualized calf veins. Visualized portions of profunda femoral vein and great saphenous vein unremarkable. No filling defects  to suggest DVT on grayscale or color Doppler imaging. Doppler waveforms show normal direction of venous flow, normal respiratory plasticity and response to augmentation. Limited views of the contralateral common femoral vein are unremarkable. OTHER Note is made of edema at the ankle. Limitations: none IMPRESSION: No occlusive deep vein thrombosis. Edema at the RIGHT ankle. Electronically Signed   By: Nolon Nations M.D.   On: 07/12/2019 21:01    Procedures Procedures (including critical care time)  Medications Ordered in ED Medications - No data to display  ED Course  I have reviewed the triage vital signs and the nursing notes.  Pertinent labs & imaging results that were available during my care of the patient were reviewed by me and considered in my medical decision making (see chart for details).  Patient seen and examined. Work-up initiated.   Vital signs reviewed and are as follows: BP (!) 160/84 (BP Location: Right Arm)   Pulse 95   Temp 98.2 F (36.8 C) (Oral)   Resp 18   Ht 5\' 11"  (1.803 m)   Wt 90.7 kg   SpO2 99%   BMI 27.88 kg/m   9:18 PM results reviewed with patient and wife at bedside.  White blood cell count and platelet count are normal.  No evidence of DVT.  Discussed trial of antibiotics with patient.  Discussed that the benefit may be limited and risks include allergic reaction and GI upset.  Discussed that his current rash appears more inflammatory, however cannot completely rule out infection of the skin.  He would like to try a course of Keflex.  He has had this in the past without any  difficulties.  Patient has seen a dermatologist.  I have encouraged him to follow-up with either his primary care doctor or dermatologist sometime this week for recheck.  Encouraged return to the emergency department for recheck with fever, worsening redness or pain in the lower extremity, or other concerns.   Clinical Course as of Jul 11 2116  Nancy Fetter Jul 12, 3583  4818 79 year old male here with acute redness tenderness and swelling of his right lower extremity.  No known trauma.  No known insect bites.  Getting some screening labs and ultrasound lower extremity.   [MB]    Clinical Course User Index [MB] Hayden Rasmussen, MD   MDM Rules/Calculators/A&P                          Patient with right lower extremity rash starting after working outside.  This does not appear to be allergic in nature.  Patient did have a low-grade temperature around the time of onset that he attributes to heat exhaustion.  No tick bites.  Lab work-up here today is reassuring.  No thrombocytopenia or leukocytosis.  Will cover for infection with Keflex.  Patient looks well.  He is reassured that there is no DVT in the leg.  Feel comfortable discharged home with close follow-up.  Return instructions as above.  Final Clinical Impression(s) / ED Diagnoses Final diagnoses:  Rash  Purpura ([place])    Rx / DC Orders ED Discharge Orders         Ordered    cephALEXin (KEFLEX) 500 MG capsule  4 times daily     Discontinue  Reprint     07/12/19 2116           Carlisle Cater, PA-C 07/12/19 2121    Hayden Rasmussen, MD 07/13/19 1143

## 2019-07-12 NOTE — ED Triage Notes (Signed)
Patient reports rash to right leg that started on Thursday.  Per wife he was outside Thursday mowing for several hours. When he came in he was nauseated and temp was elevated.  Wife reports they pushed fluids and he took a cool shower.  Patient endorses that leg was better until today.  Redness noted to shin and swelling noted to the right leg.

## 2019-07-14 ENCOUNTER — Telehealth: Payer: Self-pay

## 2019-07-14 NOTE — Telephone Encounter (Signed)
Please assist patient with scheduling Ed f/u appt.  Client [name] - Client Client Site [place] - Day Physician Crissie Sickles - MD Contact Type Call Who Is Calling Patient / Member / Family / Caregiver Caller Name Paul Knight Caller Phone Number [phone] Patient Name Paul Knight Patient DOB Nov 04, 1940 Call Type Message Only Information Provided Reason for Call Request to Schedule Office Appointment Initial Comment Caller states wants to schedule f/u appt w/Dr McGowen after ED visit. Caller declined triage. Disp. Time Disposition Final User 07/13/2019 6:22:33 AM General Information Provided Yes Jerrye Beavers Call Closed By: Jerrye Beavers Transaction Date/Time: 07/13/2019 6:19:29 AM (ET)

## 2019-07-14 NOTE — Telephone Encounter (Signed)
Appt scheduled for 7/8

## 2019-07-16 ENCOUNTER — Other Ambulatory Visit: Payer: Self-pay

## 2019-07-16 ENCOUNTER — Encounter: Payer: Self-pay | Admitting: Family Medicine

## 2019-07-16 ENCOUNTER — Ambulatory Visit (INDEPENDENT_AMBULATORY_CARE_PROVIDER_SITE_OTHER): Payer: Medicare HMO | Admitting: Family Medicine

## 2019-07-16 VITALS — BP 118/66 | HR 74 | Temp 97.9°F | Resp 16 | Ht 69.0 in | Wt 195.4 lb

## 2019-07-16 DIAGNOSIS — L03115 Cellulitis of right lower limb: Secondary | ICD-10-CM | POA: Diagnosis not present

## 2019-07-16 NOTE — Progress Notes (Signed)
OFFICE VISIT  07/16/2019   CC:  Chief Complaint  Patient presents with  . Follow-up    ED visit, rash   HPI:    Patient is a 79 y.o. Caucasian male who presents accompanied by his wife for 4 d f/u ED visit for R LL rash. Reviewed entire ED record today. Acute onset rash R LL with some pain and swelling. Labs and LE venous doppler on R leg NORMAL in ED. Via shared decision-making process, a trial of keflex was initiated for possible cellulitis, although it appeared that this dx was of lower likelihood given the presentation and appearance of the rash.  Overall, unclear etiology.    INTERIM HX: HISTORY: Says 1 wk ago he had feeling of being overheated and tired and nauseated after push mowing lawn for 2 hours.   He was noted to have temp 101.  Took cool shower.   Went to bed early (at which time his temp was 98) after drinking lots of fluids.  Next day felt "hungover" but no recurrence of fever or nausea.  Then the next day he had R LL rash from ankle to just above the knee---but was not in any pain and not itching.  It felt a bit swollen and still dose somewhat-->went to ED.   Of note (and likely unrelated to current problem), pt Has had chronic R foot numbness--sudden onset while walking a couple years ago. Ortho eval-->having ligamentous laxity that is affecting the peripheral nerves in ankle area. No procedure was done.    Since the ED visit he feels physically normal, R LL rash is essentially gone. Some swelling persists but has improved significantly.     Past Medical History:  Diagnosis Date  . Arthritis    shoulder  . Chronic right shoulder pain 12/12/2017   Reverse TSA with augmentation 03/19/2018  . 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 Erectile dysfunction   . Numbness in feet    ? Tarsal tunnel syndrome.  Ortho and neuro could not determine dx.    . Squamous cell carcinoma of skin 06/11/2016   right cheek - CX3 + 5FU    Past Surgical History:  Procedure Laterality Date  .  APPENDECTOMY  1970s  . COLONOSCOPY  01/08/2009   Normal; recall 10 yrs  . REVERSE SHOULDER ARTHROPLASTY Right 03/19/2018   Procedure: REVERSE SHOULDER ARTHROPLASTY;  Surgeon: Hiram Gash, MD;  Location: [facility];  Service: Orthopedics;  Laterality: Right;  with exparel  . TONSILLECTOMY  as a child    Outpatient Medications Prior to Visit  Medication Sig Dispense Refill  . cephALEXin (KEFLEX) 500 MG capsule Take 1 capsule (500 mg total) by mouth 4 (four) times daily. 28 capsule 0  . sildenafil (VIAGRA) 100 MG tablet Take 1 tablet (100 mg total) by mouth daily as needed. 30 tablet 3   No facility-administered medications prior to visit.    No Known Allergies  ROS As per HPI  PE: Vitals with BMI 07/16/2019 07/12/2019 07/12/2019  Height 5\' 9"  - 5\' 11"   Weight 195 lbs 6 oz - 199 lbs 14 oz  BMI 67.34 - 19.37  Systolic 902 409 -  Diastolic 66 79 -  Pulse 74 78 -  O2 sat 97% on RA today  Gen: Alert, well appearing.  Patient is oriented to person, place, time, and situation. AFFECT: pleasant, lucid thought and speech. R LL without rash.  Just a bit of patchy hyperpigmentation/pinkish hue to pretibial area, without any erythema, warmth,  or tenderness.  No palpable rash is present. He has 3+ R ankle pitting edema and this diminishes gradually as you approach the knee where it is trace.  No tenderness of calf, ankle, or foot.    LABS:  Lab Results  Component Value Date   TSH 1.20 05/20/2015   Lab Results  Component Value Date   WBC 9.5 07/12/2019   HGB 13.9 07/12/2019   HCT 41.1 07/12/2019   MCV 99.0 07/12/2019   PLT 296 07/12/2019   Lab Results  Component Value Date   CREATININE 0.94 07/12/2019   BUN 28 (H) 07/12/2019   NA 136 07/12/2019   K 3.8 07/12/2019   CL 105 07/12/2019   CO2 21 (L) 07/12/2019   No results found for: DDIMER   IMPRESSION AND PLAN:  RLL cellulitis, with some systemic sx's. All systemic sx's resolved, rash essentially resolved since getting on  abx. Finish keflex and monitor for gradual resolution of R LL swelling. Elevate R leg for 20 min per day, otherwise maintain normal activity level.  An After Visit Summary was printed and given to the patient.  FOLLOW UP: Return if symptoms worsen or fail to improve.  Signed:  Crissie Sickles, MD           07/16/2019

## 2019-08-04 DIAGNOSIS — H47091 Other disorders of optic nerve, not elsewhere classified, right eye: Secondary | ICD-10-CM | POA: Diagnosis not present

## 2019-08-04 DIAGNOSIS — H2513 Age-related nuclear cataract, bilateral: Secondary | ICD-10-CM | POA: Diagnosis not present

## 2019-08-13 ENCOUNTER — Ambulatory Visit: Payer: Medicare HMO | Admitting: Dermatology

## 2019-09-09 ENCOUNTER — Ambulatory Visit (INDEPENDENT_AMBULATORY_CARE_PROVIDER_SITE_OTHER): Payer: Medicare HMO | Admitting: Family Medicine

## 2019-09-09 ENCOUNTER — Encounter: Payer: Self-pay | Admitting: Family Medicine

## 2019-09-09 ENCOUNTER — Other Ambulatory Visit: Payer: Self-pay

## 2019-09-09 VITALS — BP 125/83 | HR 73 | Temp 97.7°F | Resp 16 | Ht 69.0 in | Wt 192.8 lb

## 2019-09-09 DIAGNOSIS — N5201 Erectile dysfunction due to arterial insufficiency: Secondary | ICD-10-CM

## 2019-09-09 DIAGNOSIS — M24271 Disorder of ligament, right ankle: Secondary | ICD-10-CM

## 2019-09-09 MED ORDER — TADALAFIL 10 MG PO TABS: ORAL_TABLET | ORAL | 0 refills | Status: DC

## 2019-09-09 NOTE — Progress Notes (Signed)
OFFICE VISIT  09/09/2019   CC:  Chief Complaint  Patient presents with  . Patient refused to go into detail    would prefer to discuss with PCP   HPI:    Patient is a 79 y.o. Caucasian male who presents for "private conversation with Dr. Anitra Lauth". Viagra not working anymore. Had taken it for years at 50mg  dose and it helped, then noted gradually less effective over the last year or two and then stopped helping sufficiently at all.  About 1 mo ago he went up to 100mg  dose b/c it stopped helping him but this did not help either and it caused mild, brief dizziness. Never on any other med for ED. Sexual desire intact but not like when he was younger. Can barely get erection and it is not firm enough for penetration. He is interested in trial of a different ED drug. He is not on any nitrates or bp meds.  He also requests orthopedic surg referral.  Reports years of R ankle instability and says he saw Dr. Noemi Chapel in the remote past and was told he would likely need surgery for "loose ligaments".  His walking has long felt unstable and he is worried about falling and breaking something.   Past Medical History:  Diagnosis Date  . Arthritis    shoulder  . Chronic right shoulder pain 12/12/2017   Reverse TSA with augmentation 03/19/2018  . 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 Erectile dysfunction   . Numbness in feet    ? Tarsal tunnel syndrome.  Ortho and neuro could not determine dx.    . Squamous cell carcinoma of skin 06/11/2016   right cheek - CX3 + 5FU    Past Surgical History:  Procedure Laterality Date  . APPENDECTOMY  1970s  . COLONOSCOPY  01/08/2009   Normal; recall 10 yrs  . REVERSE SHOULDER ARTHROPLASTY Right 03/19/2018   Procedure: REVERSE SHOULDER ARTHROPLASTY;  Surgeon: Hiram Gash, MD;  Location: [facility];  Service: Orthopedics;  Laterality: Right;  with exparel  . TONSILLECTOMY  as a child    Outpatient Medications Prior to Visit  Medication Sig Dispense Refill  . sildenafil  (VIAGRA) 100 MG tablet Take 1 tablet (100 mg total) by mouth daily as needed. 30 tablet 3  . cephALEXin (KEFLEX) 500 MG capsule Take 1 capsule (500 mg total) by mouth 4 (four) times daily. (Patient not taking: Reported on 09/09/2019) 28 capsule 0   No facility-administered medications prior to visit.    No Known Allergies  ROS As per HPI  PE: Blood pressure 125/83, pulse 73, temperature 97.7 F (36.5 C), temperature source Oral, resp. rate 16, height 5\' 9"  (1.753 m), weight 192 lb 12.8 oz (87.5 kg), SpO2 97 %. Gen: Alert, well appearing.  Patient is oriented to person, place, time, and situation. AFFECT: pleasant, lucid thought and speech.   LABS:  Lab Results  Component Value Date   TESTOSTERONE 399.45 02/06/2008     Chemistry      Component Value Date/Time   NA 136 07/12/2019 1950   K 3.8 07/12/2019 1950   CL 105 07/12/2019 1950   CO2 21 (L) 07/12/2019 1950   BUN 28 (H) 07/12/2019 1950   CREATININE 0.94 07/12/2019 1950   CREATININE 0.99 12/12/2018 1506      Component Value Date/Time   CALCIUM 8.5 (L) 07/12/2019 1950   ALKPHOS 30 (L) 05/28/2017 0926   AST 27 12/12/2018 1506   ALT 21 12/12/2018 1506   BILITOT  1.2 12/12/2018 1506     IMPRESSION AND PLAN:  1) ED, viagra no longer working. Discussed trial of another ED med vs referral to urology to discuss other treatment options. He chose trial of another med.  Cialis 10mg , 1-2 qd prn.  Therapeutic expectations and side effect profile of medication discussed today.  Patient's questions answered. #10 rx'd.  He'll call back if this helps and he'll tell me how many he wants on his next rx.  2) Chronic right ankle ligamentous laxity, gait instability. Per pt's request I have referred him back to Dr. Noemi Chapel.  An After Visit Summary was printed and given to the patient.  FOLLOW UP: Return for as needed.  Signed:  Crissie Sickles, MD           09/09/2019

## 2019-09-22 DIAGNOSIS — H2511 Age-related nuclear cataract, right eye: Secondary | ICD-10-CM | POA: Diagnosis not present

## 2019-09-22 DIAGNOSIS — H25043 Posterior subcapsular polar age-related cataract, bilateral: Secondary | ICD-10-CM | POA: Diagnosis not present

## 2019-09-22 DIAGNOSIS — H2513 Age-related nuclear cataract, bilateral: Secondary | ICD-10-CM | POA: Diagnosis not present

## 2019-09-22 DIAGNOSIS — H25013 Cortical age-related cataract, bilateral: Secondary | ICD-10-CM | POA: Diagnosis not present

## 2019-09-22 DIAGNOSIS — H18413 Arcus senilis, bilateral: Secondary | ICD-10-CM | POA: Diagnosis not present

## 2019-09-25 DIAGNOSIS — H2511 Age-related nuclear cataract, right eye: Secondary | ICD-10-CM | POA: Diagnosis not present

## 2019-09-25 DIAGNOSIS — H2513 Age-related nuclear cataract, bilateral: Secondary | ICD-10-CM | POA: Diagnosis not present

## 2019-10-07 ENCOUNTER — Other Ambulatory Visit: Payer: Self-pay

## 2019-10-07 ENCOUNTER — Ambulatory Visit: Payer: Medicare HMO

## 2019-10-07 ENCOUNTER — Ambulatory Visit (INDEPENDENT_AMBULATORY_CARE_PROVIDER_SITE_OTHER): Payer: Medicare HMO

## 2019-10-07 DIAGNOSIS — Z23 Encounter for immunization: Secondary | ICD-10-CM | POA: Diagnosis not present

## 2019-10-19 DIAGNOSIS — H2512 Age-related nuclear cataract, left eye: Secondary | ICD-10-CM | POA: Diagnosis not present

## 2019-10-19 DIAGNOSIS — H2513 Age-related nuclear cataract, bilateral: Secondary | ICD-10-CM | POA: Diagnosis not present

## 2019-12-17 ENCOUNTER — Encounter: Payer: Medicare HMO | Admitting: Family Medicine

## 2020-01-12 DIAGNOSIS — M25511 Pain in right shoulder: Secondary | ICD-10-CM | POA: Diagnosis not present

## 2020-01-12 DIAGNOSIS — M25571 Pain in right ankle and joints of right foot: Secondary | ICD-10-CM | POA: Diagnosis not present

## 2020-01-14 ENCOUNTER — Other Ambulatory Visit: Payer: Self-pay

## 2020-01-14 ENCOUNTER — Encounter: Payer: Self-pay | Admitting: Family Medicine

## 2020-01-14 ENCOUNTER — Ambulatory Visit (INDEPENDENT_AMBULATORY_CARE_PROVIDER_SITE_OTHER): Payer: Medicare HMO | Admitting: Family Medicine

## 2020-01-14 VITALS — BP 129/83 | HR 85 | Temp 97.8°F | Resp 16 | Ht 67.5 in | Wt 196.8 lb

## 2020-01-14 DIAGNOSIS — R200 Anesthesia of skin: Secondary | ICD-10-CM | POA: Diagnosis not present

## 2020-01-14 DIAGNOSIS — E663 Overweight: Secondary | ICD-10-CM | POA: Diagnosis not present

## 2020-01-14 DIAGNOSIS — Z0000 Encounter for general adult medical examination without abnormal findings: Secondary | ICD-10-CM | POA: Diagnosis not present

## 2020-01-14 LAB — LIPID PANEL
Cholesterol: 120 mg/dL (ref 0–200)
HDL: 55.9 mg/dL (ref >39.00)
LDL Cholesterol: 54 mg/dL (ref 0–99)
NonHDL: 64.01
Total CHOL/HDL Ratio: 2
Triglycerides: 52 mg/dL (ref 0.0–149.0)
VLDL: 10.4 mg/dL (ref 0.0–40.0)

## 2020-01-14 LAB — COMPREHENSIVE METABOLIC PANEL
ALT: 19 U/L (ref 0–53)
AST: 25 U/L (ref 0–37)
Albumin: 4.1 g/dL (ref 3.5–5.2)
Alkaline Phosphatase: 30 U/L — ABNORMAL LOW (ref 39–117)
BUN: 20 mg/dL (ref 6–23)
CO2: 28 mEq/L (ref 19–32)
Calcium: 9.3 mg/dL (ref 8.4–10.5)
Chloride: 104 mEq/L (ref 96–112)
Creatinine, Ser: 1.01 mg/dL (ref 0.40–1.50)
GFR: 70.69 mL/min (ref >60.00)
Glucose, Bld: 93 mg/dL (ref 70–99)
Potassium: 4.5 mEq/L (ref 3.5–5.1)
Sodium: 138 mEq/L (ref 135–145)
Total Bilirubin: 1.4 mg/dL — ABNORMAL HIGH (ref 0.2–1.2)
Total Protein: 6.9 g/dL (ref 6.0–8.3)

## 2020-01-14 LAB — VITAMIN B12: Vitamin B-12: 285 pg/mL (ref 211–911)

## 2020-01-14 NOTE — Progress Notes (Addendum)
Office Note 01/14/2020  CC:  Chief Complaint  Patient presents with  . Annual Exam    Pt is fasting    HPI:  Paul Knight is a 80 y.o. White male who is here for annual health maintenance exam.  I last saw him a few months ago for ED, viagra had become ineffective and we tried cialis.  Chronic R ankle instability, now starting to get sore and swell some, went to [facility] 2 d/a. X-ray "not that bad", per pt but no specifics.  MRI is set up for tomorrow, also set up for appt with Dr. Latina Craver, ankle/foot specialist soon. 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  Past Medical History:  Diagnosis Date  . Chronic right shoulder pain 12/12/2017   Reverse TSA with augmentation 03/19/2018  . Depression 1997  . Erectile dysfunction   . Numbness in feet    ? Tarsal tunnel syndrome.  Ortho and neuro could not determine dx.    . Osteoarthritis, multiple sites    shoulder esp  . Squamous cell carcinoma of skin 06/11/2016   right cheek - CX3 + 5FU    Past Surgical History:  Procedure Laterality Date  . APPENDECTOMY  1970s  . COLONOSCOPY  01/08/2009   Normal; recall 10 yrs  . REVERSE SHOULDER ARTHROPLASTY Right 03/19/2018   Procedure: REVERSE SHOULDER ARTHROPLASTY;  Surgeon: Hiram Gash, MD;  Location: [facility];  Service: Orthopedics;  Laterality: Right;  with exparel  . TONSILLECTOMY  as a child    Family History  Problem Relation Age of Onset  . Heart failure Mother   . Heart attack Mother        Deceased, 18  . Other Father        Deceased, 93  . Heart disease Brother   . Healthy Son   . Thyroid disease Daughter   . Cancer Daughter        ovarian    Social History   Socioeconomic History  . Marital status: Married    Spouse name: Not on file  . Number of children: 4  . Years of education: Not on file  . Highest education level: Not on file  Occupational History   . Occupation: Retired  Tobacco Use  . Smoking status: Former Smoker    Packs/day: 0.25    Years: 10.00    Pack years: 2.50    Types: Cigarettes  . Smokeless tobacco: Never Used  . Tobacco comment: qiut  when 80 years old  Vaping Use  . Vaping Use: Never used  Substance and Sexual Activity  . Alcohol use: Yes    Alcohol/week: 1.0 - 2.0 standard drink    Types: 1 - 2 Glasses of wine per week    Comment: 2 glasses of wine nightly x 5 years  . Drug use: No  . Sexual activity: Not Currently  Other Topics Concern  . Not on file  Social History Narrative   Married, 4 children in the [place] area.  Grandchildren x 7.   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   No tobacco.  Alcohol 2 glasses wine/day.   Exercise: [employer], walking in park, stationary bike at home: 3 days per week for 1 hour.   Social Determinants of Health   Financial Resource Strain: Not on file  Food Insecurity: Not on file  Transportation Needs: Not on file  Physical  Activity: Not on file  Stress: Not on file  Social Connections: Not on file  Intimate Partner Violence: Not on file    Outpatient Medications Prior to Visit  Medication Sig Dispense Refill  . tadalafil (CIALIS) 10 MG tablet 1-2 tabs po qd prn intercourse.  Take 30 min prior to intercourse. (Patient not taking: Reported on 01/14/2020) 10 tablet 0   No facility-administered medications prior to visit.    No Known Allergies  ROS Review of Systems  Constitutional: Negative for appetite change, chills, fatigue and fever.  HENT: Negative for congestion, dental problem, ear pain and sore throat.   Eyes: Negative for discharge, redness and visual disturbance.  Respiratory: Negative for cough, chest tightness, shortness of breath and wheezing.   Cardiovascular: Negative for chest pain, palpitations and leg swelling.  Gastrointestinal: Negative for abdominal pain, blood in stool, diarrhea, nausea and vomiting.  Genitourinary: Negative for  difficulty urinating, dysuria, flank pain, frequency, hematuria and urgency.  Musculoskeletal: Positive for arthralgias (right ankle). Negative for back pain, joint swelling, myalgias and neck stiffness.  Skin: Negative for pallor and rash.  Neurological: Negative for dizziness, speech difficulty, weakness and headaches.  Hematological: Negative for adenopathy. Does not bruise/bleed easily.  Psychiatric/Behavioral: Negative for confusion and sleep disturbance. The patient is not nervous/anxious.    PE; Vitals with BMI 01/14/2020 09/09/2019 07/16/2019  Height 5' 7.5" 5\' 9"  5\' 9"   Weight 196 lbs 13 oz 192 lbs 13 oz 195 lbs 6 oz  BMI 30.35 28.46 28.84  Systolic 129 125 768  Diastolic 83 83 66  Pulse 85 73 74   Gen: Alert, well appearing.  Patient is oriented to person, place, time, and situation. AFFECT: pleasant, lucid thought and speech. ENT: Ears: EACs clear, normal epithelium.  TMs with good light reflex and landmarks bilaterally.  Eyes: no injection, icteris, swelling, or exudate.  EOMI, PERRLA. Nose: no drainage or turbinate edema/swelling.  No injection or focal lesion.  Mouth: lips without lesion/swelling.  Oral mucosa pink and moist.  Dentition intact and without obvious caries or gingival swelling.  Oropharynx without erythema, exudate, or swelling.  Neck: supple/nontender.  No LAD, mass, or TM.  Carotid pulses 2+ bilaterally, without bruits. CV: RRR, no m/r/g.   LUNGS: CTA bilat, nonlabored resps, good aeration in all lung fields. ABD: soft, NT, ND, BS normal.  No hepatospenomegaly or mass.  No bruits. EXT: no clubbing, cyanosis, or edema.  Musculoskeletal: no joint swelling, erythema, warmth, or tenderness.  ROM of all joints intact. Skin - no sores or suspicious lesions or rashes or color changes Neuro: CN 2-12 intact bilaterally, strength 5/5 in proximal and distal upper extremities and lower extremities bilaterally.  No sensory deficits.  No tremor.  No disdiadochokinesis.  No  ataxia. He walks/stands with R ankle/foot in pronated position.  No shuffling or postural instability. Upper extremity and lower extremity DTRs symmetric.  No pronator drift.  No cogwheeling.  Good/normal range of facial expression.  No bradykinesia.   Pertinent labs:  Lab Results  Component Value Date   TSH 1.20 05/20/2015   Lab Results  Component Value Date   WBC 9.5 07/12/2019   HGB 13.9 07/12/2019   HCT 41.1 07/12/2019   MCV 99.0 07/12/2019   PLT 296 07/12/2019   Lab Results  Component Value Date   CREATININE 0.94 07/12/2019   BUN 28 (H) 07/12/2019   NA 136 07/12/2019   K 3.8 07/12/2019   CL 105 07/12/2019   CO2 21 (L) 07/12/2019  Lab Results  Component Value Date   ALT 21 12/12/2018   AST 27 12/12/2018   ALKPHOS 30 (L) 05/28/2017   BILITOT 1.2 12/12/2018   Lab Results  Component Value Date   CHOL 125 12/12/2018   Lab Results  Component Value Date   HDL 54 12/12/2018   Lab Results  Component Value Date   LDLCALC 58 12/12/2018   Lab Results  Component Value Date   TRIG 52 12/12/2018   Lab Results  Component Value Date   CHOLHDL 2.3 12/12/2018   Lab Results  Component Value Date   PSA 0.44 02/27/2013   PSA 0.43 08/09/2010   PSA 0.39 08/05/2009    ASSESSMENT AND PLAN:   Health maintenance exam: Reviewed age and gender appropriate health maintenance issues (prudent diet, regular exercise, health risks of tobacco and excessive alcohol, use of seatbelts, fire alarms in home, use of sunscreen).  Also reviewed age and gender appropriate health screening as well as vaccine recommendations. Vaccines: Shingrix->sent rx to pharmacy today.  Otherwise ALL UTD. Labs: cmet, flp. Prostate ca screening: no hx of abnormal PSAs.  No further prostate ca screening indicated due to age. Colon ca screening: normal colonoscopy 2011.  Pt has declined any further colon ca screening.  R ankle instability, chronic.  Undergoing ortho eval with MRI and ankle/foot  specialist. 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 Ortho has rx'd gabapentin for this and he'll start this any day now.  An After Visit Summary was printed and given to the patient.  FOLLOW UP:  Return in about 1 year (around 01/13/2021) for annual CPE (fasting).  Signed:  Santiago Bumpers, MD           01/14/2020

## 2020-01-15 DIAGNOSIS — M25571 Pain in right ankle and joints of right foot: Secondary | ICD-10-CM | POA: Diagnosis not present

## 2020-01-18 DIAGNOSIS — M24871 Other specific joint derangements of right ankle, not elsewhere classified: Secondary | ICD-10-CM | POA: Diagnosis not present

## 2020-01-18 DIAGNOSIS — M25571 Pain in right ankle and joints of right foot: Secondary | ICD-10-CM | POA: Diagnosis not present

## 2020-02-19 DIAGNOSIS — M76821 Posterior tibial tendinitis, right leg: Secondary | ICD-10-CM | POA: Diagnosis not present

## 2020-05-13 ENCOUNTER — Ambulatory Visit: Payer: Medicare HMO | Admitting: Family Medicine

## 2020-05-20 DIAGNOSIS — M25571 Pain in right ankle and joints of right foot: Secondary | ICD-10-CM | POA: Diagnosis not present

## 2020-05-20 DIAGNOSIS — M79671 Pain in right foot: Secondary | ICD-10-CM | POA: Diagnosis not present

## 2020-05-26 DIAGNOSIS — M25671 Stiffness of right ankle, not elsewhere classified: Secondary | ICD-10-CM | POA: Diagnosis not present

## 2020-05-26 DIAGNOSIS — M7661 Achilles tendinitis, right leg: Secondary | ICD-10-CM | POA: Diagnosis not present

## 2020-05-26 DIAGNOSIS — R531 Weakness: Secondary | ICD-10-CM | POA: Diagnosis not present

## 2020-05-30 ENCOUNTER — Other Ambulatory Visit: Payer: Self-pay | Admitting: Orthopaedic Surgery

## 2020-05-30 DIAGNOSIS — M25671 Stiffness of right ankle, not elsewhere classified: Secondary | ICD-10-CM | POA: Diagnosis not present

## 2020-05-30 DIAGNOSIS — R531 Weakness: Secondary | ICD-10-CM | POA: Diagnosis not present

## 2020-05-30 DIAGNOSIS — M7661 Achilles tendinitis, right leg: Secondary | ICD-10-CM | POA: Diagnosis not present

## 2020-05-30 DIAGNOSIS — M25571 Pain in right ankle and joints of right foot: Secondary | ICD-10-CM

## 2020-06-02 DIAGNOSIS — R531 Weakness: Secondary | ICD-10-CM | POA: Diagnosis not present

## 2020-06-02 DIAGNOSIS — M7661 Achilles tendinitis, right leg: Secondary | ICD-10-CM | POA: Diagnosis not present

## 2020-06-02 DIAGNOSIS — M25671 Stiffness of right ankle, not elsewhere classified: Secondary | ICD-10-CM | POA: Diagnosis not present

## 2020-06-08 ENCOUNTER — Ambulatory Visit (INDEPENDENT_AMBULATORY_CARE_PROVIDER_SITE_OTHER): Payer: Medicare HMO

## 2020-06-08 ENCOUNTER — Other Ambulatory Visit: Payer: Self-pay

## 2020-06-08 VITALS — BP 118/76 | HR 68 | Temp 97.5°F | Resp 16 | Ht 67.5 in | Wt 194.0 lb

## 2020-06-08 DIAGNOSIS — R531 Weakness: Secondary | ICD-10-CM | POA: Diagnosis not present

## 2020-06-08 DIAGNOSIS — Z0000 Encounter for general adult medical examination without abnormal findings: Secondary | ICD-10-CM | POA: Diagnosis not present

## 2020-06-08 DIAGNOSIS — M7661 Achilles tendinitis, right leg: Secondary | ICD-10-CM | POA: Diagnosis not present

## 2020-06-08 DIAGNOSIS — M25671 Stiffness of right ankle, not elsewhere classified: Secondary | ICD-10-CM | POA: Diagnosis not present

## 2020-06-08 NOTE — Patient Instructions (Signed)
Paul Knight , Thank you for taking time to come for your Medicare Wellness Visit. I appreciate your ongoing commitment to your health goals. Please review the following plan we discussed and let me know if I can assist you in the future.   Screening recommendations/referrals: Colonoscopy: No longer required Recommended yearly ophthalmology/optometry visit for glaucoma screening and checkup Recommended yearly dental visit for hygiene and checkup  Vaccinations: Influenza vaccine: Up to date Pneumococcal vaccine: Completed vaccines Tdap vaccine: Up to date-Due 06/04/2023 Shingles vaccine: Discuss with pharmacy  Covid-19: Up to date  Advanced directives: Copy in chart  Conditions/risks identified: See problem list  Next appointment: Follow up in one year for your annual wellness visit.   Preventive Care 80 Years and Older, Male Preventive care refers to lifestyle choices and visits with your health care provider that can promote health and wellness. What does preventive care include?  A yearly physical exam. This is also called an annual well check.  Dental exams once or twice a year.  Routine eye exams. Ask your health care provider how often you should have your eyes checked.  Personal lifestyle choices, including:  Daily care of your teeth and gums.  Regular physical activity.  Eating a healthy diet.  Avoiding tobacco and drug use.  Limiting alcohol use.  Practicing safe sex.  Taking low doses of aspirin every day.  Taking vitamin and mineral supplements as recommended by your health care provider. What happens during an annual well check? The services and screenings done by your health care provider during your annual well check will depend on your age, overall health, lifestyle risk factors, and family history of disease. Counseling  Your health care provider may ask you questions about your:  Alcohol use.  Tobacco use.  Drug use.  Emotional well-being.  Home  and relationship well-being.  Sexual activity.  Eating habits.  History of falls.  Memory and ability to understand (cognition).  Work and work Statistician. Screening  You may have the following tests or measurements:  Height, weight, and BMI.  Blood pressure.  Lipid and cholesterol levels. These may be checked every 5 years, or more frequently if you are over 80 years old.  Skin check.  Lung cancer screening. You may have this screening every year starting at age 80 if you have a 30-pack-year history of smoking and currently smoke or have quit within the past 80 years.  Fecal occult blood test (FOBT) of the stool. You may have this test every year starting at age 80.  Flexible sigmoidoscopy or colonoscopy. You may have a sigmoidoscopy every 5 years or a colonoscopy every 10 years starting at age 80.  Prostate cancer screening. Recommendations will vary depending on your family history and other risks.  Hepatitis C blood test.  Hepatitis B blood test.  Sexually transmitted disease (STD) testing.  Diabetes screening. This is done by checking your blood sugar (glucose) after you have not eaten for a while (fasting). You may have this done every 1-3 years.  Abdominal aortic aneurysm (AAA) screening. You may need this if you are a current or former smoker.  Osteoporosis. You may be screened starting at age 80 if you are at high risk. Talk with your health care provider about your test results, treatment options, and if necessary, the need for more tests. Vaccines  Your health care provider may recommend certain vaccines, such as:  Influenza vaccine. This is recommended every year.  Tetanus, diphtheria, and acellular pertussis (Tdap, Td) vaccine.  You may need a Td booster every 10 years.  Zoster vaccine. You may need this after age 80.  Pneumococcal 13-valent conjugate (PCV13) vaccine. One dose is recommended after age 80.  Pneumococcal polysaccharide (PPSV23) vaccine.  One dose is recommended after age 80. Talk to your health care provider about which screenings and vaccines you need and how often you need them. This information is not intended to replace advice given to you by your health care provider. Make sure you discuss any questions you have with your health care provider. Document Released: 01/21/2015 Document Revised: 09/14/2015 Document Reviewed: 10/26/2014 Elsevier Interactive Patient Education  2017 [place] Prevention in the Home Falls can cause injuries. They can happen to people of all ages. There are many things you can do to make your home safe and to help prevent falls. What can I do on the outside of my home?  Regularly fix the edges of walkways and driveways and fix any cracks.  Remove anything that might make you trip as you walk through a door, such as a raised step or threshold.  Trim any bushes or trees on the path to your home.  Use bright outdoor lighting.  Clear any walking paths of anything that might make someone trip, such as rocks or tools.  Regularly check to see if handrails are loose or broken. Make sure that both sides of any steps have handrails.  Any raised decks and porches should have guardrails on the edges.  Have any leaves, snow, or ice cleared regularly.  Use sand or salt on walking paths during winter.  Clean up any spills in your garage right away. This includes oil or grease spills. What can I do in the bathroom?  Use night lights.  Install grab bars by the toilet and in the tub and shower. Do not use towel bars as grab bars.  Use non-skid mats or decals in the tub or shower.  If you need to sit down in the shower, use a plastic, non-slip stool.  Keep the floor dry. Clean up any water that spills on the floor as soon as it happens.  Remove soap buildup in the tub or shower regularly.  Attach bath mats securely with double-sided non-slip rug tape.  Do not have throw rugs and other  things on the floor that can make you trip. What can I do in the bedroom?  Use night lights.  Make sure that you have a light by your bed that is easy to reach.  Do not use any sheets or blankets that are too big for your bed. They should not hang down onto the floor.  Have a firm chair that has side arms. You can use this for support while you get dressed.  Do not have throw rugs and other things on the floor that can make you trip. What can I do in the kitchen?  Clean up any spills right away.  Avoid walking on wet floors.  Keep items that you use a lot in easy-to-reach places.  If you need to reach something above you, use a strong step stool that has a grab bar.  Keep electrical cords out of the way.  Do not use floor polish or wax that makes floors slippery. If you must use wax, use non-skid floor wax.  Do not have throw rugs and other things on the floor that can make you trip. What can I do with my stairs?  Do not leave any  items on the stairs.  Make sure that there are handrails on both sides of the stairs and use them. Fix handrails that are broken or loose. Make sure that handrails are as long as the stairways.  Check any carpeting to make sure that it is firmly attached to the stairs. Fix any carpet that is loose or worn.  Avoid having throw rugs at the top or bottom of the stairs. If you do have throw rugs, attach them to the floor with carpet tape.  Make sure that you have a light switch at the top of the stairs and the bottom of the stairs. If you do not have them, ask someone to add them for you. What else can I do to help prevent falls?  Wear shoes that:  Do not have high heels.  Have rubber bottoms.  Are comfortable and fit you well.  Are closed at the toe. Do not wear sandals.  If you use a stepladder:  Make sure that it is fully opened. Do not climb a closed stepladder.  Make sure that both sides of the stepladder are locked into place.  Ask  someone to hold it for you, if possible.  Clearly mark and make sure that you can see:  Any grab bars or handrails.  First and last steps.  Where the edge of each step is.  Use tools that help you move around (mobility aids) if they are needed. These include:  Canes.  Walkers.  Scooters.  Crutches.  Turn on the lights when you go into a dark area. Replace any light bulbs as soon as they burn out.  Set up your furniture so you have a clear path. Avoid moving your furniture around.  If any of your floors are uneven, fix them.  If there are any pets around you, be aware of where they are.  Review your medicines with your doctor. Some medicines can make you feel dizzy. This can increase your chance of falling. Ask your doctor what other things that you can do to help prevent falls. This information is not intended to replace advice given to you by your health care provider. Make sure you discuss any questions you have with your health care provider. Document Released: 10/21/2008 Document Revised: 06/02/2015 Document Reviewed: 01/29/2014 Elsevier Interactive Patient Education  2017 [employer].

## 2020-06-08 NOTE — Progress Notes (Signed)
Subjective:   Paul Knight is a 80 y.o. male who presents for Medicare Annual/Subsequent preventive examination.  Review of Systems     Cardiac Risk Factors include: advanced age (>67men, >77 women);male gender     Objective:    Today's Vitals   06/08/20 1108 06/08/20 1115  BP: 118/76   Pulse: 68   Resp: 16   Temp: (!) 97.5 F (36.4 C)   TempSrc: Temporal   SpO2: 98%   Weight: 194 lb (88 kg)   Height: 5' 7.5" (1.715 m)   PainSc:  4    Body mass index is 29.94 kg/m.  Advanced Directives 06/08/2020 07/12/2019 03/19/2018 03/19/2018 03/12/2018 05/28/2017 05/21/2016  Does Patient Have a Medical Advance Directive? Yes No Yes Yes Yes Yes Yes  Type of [employer] of [place];Living will [place];Living will [place];Living will Healthcare Power of [place]  Does patient want to make changes to medical advance directive? - - No - Patient declined - No - Patient declined - -  Copy of [place] in Chart? Yes - validated most recent copy scanned in chart (See row information) - No - copy requested No - copy requested No - copy requested - No - copy requested  Would patient like information on creating a medical advance directive? - No - Patient declined - - - Yes (MAU/Ambulatory/Procedural Areas - Information given) -    Current Medications (verified) Outpatient Encounter Medications as of 06/08/2020  Medication Sig  . naproxen (NAPROSYN) 250 MG tablet Take by mouth 2 (two) times daily with a meal.  . tadalafil (CIALIS) 10 MG tablet 1-2 tabs po qd prn intercourse.  Take 30 min prior to intercourse. (Patient not taking: No sig reported)   No facility-administered encounter medications on file as of 06/08/2020.    Allergies (verified) Patient has no known allergies.   History: Past Medical History:  Diagnosis Date  . 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 Depression 1997  . Erectile dysfunction   . Numbness in feet    ? Tarsal tunnel syndrome.  Ortho and neuro could not determine dx.    . Osteoarthritis, multiple sites    shoulder esp  . Squamous cell carcinoma of skin 06/11/2016   right cheek - CX3 + 5FU   Past Surgical History:  Procedure Laterality Date  . APPENDECTOMY  1970s  . COLONOSCOPY  01/08/2009   Normal; recall 10 yrs  . REVERSE SHOULDER ARTHROPLASTY Right 03/19/2018   Procedure: REVERSE SHOULDER ARTHROPLASTY;  Surgeon: Hiram Gash, MD;  Location: [facility];  Service: Orthopedics;  Laterality: Right;  with exparel  . TONSILLECTOMY  as a child   Family History  Problem Relation Age of Onset  . Heart failure Mother   . Heart attack Mother        Deceased, 68  . Other Father        Deceased, 55  . Heart disease Brother   . Healthy Son   . Thyroid disease Daughter   . Cancer Daughter        ovarian   Social History   Socioeconomic History  . Marital status: Married    Spouse name: Not on file  . Number of children: 4  . Years of education: Not on file  . Highest education level: Not on file  Occupational History  . Occupation:  Retired  Tobacco Use  . Smoking status: Former Smoker    Packs/day: 0.25    Years: 10.00    Pack years: 2.50    Types: Cigarettes  . Smokeless tobacco: Never Used  . Tobacco comment: qiut  when 80 years old  Vaping Use  . Vaping Use: Never used  Substance and Sexual Activity  . Alcohol use: Yes    Alcohol/week: 1.0 - 2.0 standard drink    Types: 1 - 2 Glasses of wine per week    Comment: 2 glasses of wine nightly x 5 years  . Drug use: No  . Sexual activity: Not Currently  Other Topics Concern  . Not on file  Social History Narrative   Married, 4 children in the [place] area.  Grandchildren x 7.   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   No tobacco.  Alcohol 2 glasses wine/day.   Exercise: [employer] fitness, walking in park,  stationary bike at home: 3 days per week for 1 hour.   Social Determinants of Health   Financial Resource Strain: Low Risk   . Difficulty of Paying Living Expenses: Not hard at all  Food Insecurity: No Food Insecurity  . Worried About Charity fundraiser in the Last Year: Never true  . Ran Out of Food in the Last Year: Never true  Transportation Needs: No Transportation Needs  . Lack of Transportation (Medical): No  . Lack of Transportation (Non-Medical): No  Physical Activity: Insufficiently Active  . Days of Exercise per Week: 2 days  . Minutes of Exercise per Session: 30 min  Stress: No Stress Concern Present  . Feeling of Stress : Not at all  Social Connections: Moderately Integrated  . Frequency of Communication with Friends and Family: More than three times a week  . Frequency of Social Gatherings with Friends and Family: More than three times a week  . Attends Religious Services: More than 4 times per year  . Active Member of Clubs or Organizations: No  . Attends Archivist Meetings: Never  . Marital Status: Married    Tobacco Counseling Counseling given: Not Answered Comment: qiut  when 80 years old   Clinical Intake:  Pre-visit preparation completed: Yes  Pain : 0-10 Pain Score: 4  Pain Type: Chronic pain Pain Location: Foot (and ankle-Seeing Ortho) Pain Orientation: Right Pain Onset: More than a month ago Pain Frequency: Constant     Nutritional Status: BMI 25 -29 Overweight Nutritional Risks: None Diabetes: No  How often do you need to have someone help you when you read instructions, pamphlets, or other written materials from your doctor or pharmacy?: 1 - Never  Diabetic?No  Interpreter Needed?: No  Information entered by :: Caroleen Hamman LPN   Activities of Daily Living In your present state of health, do you have any difficulty performing the following activities: 06/08/2020 01/14/2020  Hearing? Y Y  Comment mild hearing loss -   Vision? N N  Difficulty concentrating or making decisions? Y Y  Comment occasionally forgets names minor, due to age  Walking or climbing stairs? N Y  Dressing or bathing? N N  Doing errands, shopping? N N  Preparing Food and eating ? N -  Using the Toilet? N -  In the past six months, have you accidently leaked urine? N -  Do you have problems with loss of bowel control? N -  Managing your Medications? N -  Managing your Finances? N -  Housekeeping or managing your  Housekeeping? N -  Some recent data might be hidden    Patient Care Team: Tammi Sou, MD as PCP - General (Family Medicine) Alda Berthold, DO as Consulting Physician (Neurology) Berle Mull, MD as Consulting Physician (Sports Medicine) Lavonna Monarch, MD as Consulting Physician (Dermatology) Elsie Saas, MD as Consulting Physician (Orthopedic Surgery) Hiram Gash, MD as Consulting Physician (Orthopedic Surgery)  Indicate any recent [place] you may have received from other than [facility] providers in the past year (date may be approximate).     Assessment:   This is a routine wellness examination for [place].  Hearing/Vision screen  Hearing Screening   125Hz  250Hz  500Hz  1000Hz  2000Hz  3000Hz  4000Hz  6000Hz  8000Hz   Right ear:           Left ear:           Comments: C/o mild hearing loss  Vision Screening Comments: Wears glasses Last eye exam-2022-Dr. Felton Clinton  Dietary issues and exercise activities discussed: Current Exercise Habits: Home exercise routine, Type of exercise: strength training/weights, Time (Minutes): 30, Frequency (Times/Week): 2, Weekly Exercise (Minutes/Week): 60, Intensity: Mild, Exercise limited by: Other - see comments (foot pain)  Goals Addressed            This Visit's Progress   . Patient Stated   On track    Maintain current health by staying active.       Depression Screen PHQ 2/9 Scores 06/08/2020 01/14/2020 05/28/2017 05/28/2017 05/21/2016 05/20/2014 05/14/2013  PHQ - 2  Score 0 0 0 0 0 0 0  PHQ- 9 Score - - 1 - - - -    Fall Risk Fall Risk  06/08/2020 01/14/2020 05/28/2017 05/21/2016 05/20/2014  Falls in the past year? 1 1 Yes No Yes  Number falls in past yr: 0 1 2 or more - 1  Injury with Fall? 0 0 No - No  Risk Factor Category  - - High Fall Risk - -  Risk for fall due to : - - Other (Comment) - -  Risk for fall due to: Comment - - weak right ankle d/t stretched tendon - -  Follow up Falls prevention discussed Falls evaluation completed Falls prevention discussed - -    FALL RISK PREVENTION PERTAINING TO THE HOME:  Any stairs in or around the home? No  Home free of loose throw rugs in walkways, pet beds, electrical cords, etc? Yes  Adequate lighting in your home to reduce risk of falls? Yes   ASSISTIVE DEVICES UTILIZED TO PREVENT FALLS:  Life alert? No  Use of a cane, walker or w/c? No  Grab bars in the bathroom? Yes  Shower chair or bench in shower? No  Elevated toilet seat or a handicapped toilet? No   TIMED UP AND GO:  Was the test performed? Yes .  Length of time to ambulate 10 feet: 11 sec.   Gait slow and steady without use of assistive device  Cognitive Function:Normal cognitive status assessed by direct observation by this Nurse Health Advisor. No abnormalities found.   MMSE - Mini Mental State Exam 05/28/2017  Orientation to time 5  Orientation to Place 5  Registration 3  Attention/ Calculation 5  Recall 2  Language- name 2 objects 2  Language- repeat 1  Language- follow 3 step command 3  Language- read & follow direction 1  Write a sentence 1  Copy design 1  Total score 29     6CIT Screen 06/08/2020  What Year? 0 points  What month? 0 points  What time? 0 points  Count back from 20 0 points  Months in reverse 2 points  Repeat phrase 2 points  Total Score 4    Immunizations Immunization History  Administered Date(s) Administered  . Fluad Quad(high Dose 65+) 09/26/2018, 10/07/2019  . Influenza, High Dose Seasonal PF  09/16/2015, 10/04/2016, 10/21/2017  . Influenza,inj,Quad PF,6+ Mos 10/12/2013, 10/06/2014  . PFIZER(Purple Top)SARS-COV-2 Vaccination 01/23/2019, 02/13/2019, 11/16/2019, 05/06/2020  . Pneumococcal Conjugate-13 05/20/2014  . Pneumococcal Polysaccharide-23 09/17/2006  . Td 08/09/2002  . Tdap 05/14/2013  . Zoster, Live 08/09/2010    TDAP status: Up to date  Flu Vaccine status: Up to date  Pneumococcal vaccine status: Up to date  Covid-19 vaccine status: Completed vaccines  Qualifies for Shingles Vaccine? Yes   Zostavax completed Yes   Shingrix Completed?: No.    Education has been provided regarding the importance of this vaccine. Patient has been advised to call insurance company to determine out of pocket expense if they have not yet received this vaccine. Advised may also receive vaccine at local pharmacy or Health Dept. Verbalized acceptance and understanding.  Screening Tests Health Maintenance  Topic Date Due  . Zoster Vaccines- Shingrix (1 of 2) Never done  . INFLUENZA VACCINE  08/08/2020  . TETANUS/TDAP  05/15/2023  . COVID-19 Vaccine  Completed  . PNA vac Low Risk Adult  Completed  . HPV VACCINES  Aged Out    Health Maintenance  Health Maintenance Due  Topic Date Due  . Zoster Vaccines- Shingrix (1 of 2) Never done    Colorectal cancer screening: No longer required.   Lung Cancer Screening: (Low Dose CT Chest recommended if Age 50-80 years, 30 pack-year currently smoking OR have quit w/in 15years.) does not qualify.    Additional Screening:  Hepatitis C Screening: does not qualify  Vision Screening: Recommended annual ophthalmology exams for early detection of glaucoma and other disorders of the eye. Is the patient up to date with their annual eye exam?  Yes  Who is the provider or what is the name of the office in which the patient attends annual eye exams? Dr. Felton Clinton   Dental Screening: Recommended annual dental exams for proper oral hygiene  Community  Resource Referral / Chronic Care Management: CRR required this visit?  No   CCM required this visit?  No      Plan:     I have personally reviewed and noted the following in the patient's chart:   . Medical and social history . Use of alcohol, tobacco or illicit drugs  . Current medications and supplements including opioid prescriptions. Patient is not currently taking opioid prescriptions. . Functional ability and status . Nutritional status . Physical activity . Advanced directives . List of other physicians . Hospitalizations, surgeries, and ER visits in previous 12 months . Vitals . Screenings to include cognitive, depression, and falls . Referrals and appointments  In addition, I have reviewed and discussed with patient certain preventive protocols, quality metrics, and best practice recommendations. A written personalized care plan for preventive services as well as general preventive health recommendations were provided to patient.   Patient would like to access avs on mychart.  Marta Antu, LPN   [id number]  Nurse Health Advisor  Nurse Notes: None

## 2020-06-10 DIAGNOSIS — M25671 Stiffness of right ankle, not elsewhere classified: Secondary | ICD-10-CM | POA: Diagnosis not present

## 2020-06-10 DIAGNOSIS — R531 Weakness: Secondary | ICD-10-CM | POA: Diagnosis not present

## 2020-06-10 DIAGNOSIS — M7661 Achilles tendinitis, right leg: Secondary | ICD-10-CM | POA: Diagnosis not present

## 2020-06-11 ENCOUNTER — Ambulatory Visit
Admission: RE | Admit: 2020-06-11 | Discharge: 2020-06-11 | Disposition: A | Discharge status: Home / Self Care | Payer: Medicare HMO | Source: Ambulatory Visit | Attending: Orthopaedic Surgery | Admitting: Orthopaedic Surgery

## 2020-06-11 ENCOUNTER — Other Ambulatory Visit: Payer: Self-pay

## 2020-06-11 DIAGNOSIS — S86311A Strain of muscle(s) and tendon(s) of peroneal muscle group at lower leg level, right leg, initial encounter: Secondary | ICD-10-CM | POA: Diagnosis not present

## 2020-06-11 DIAGNOSIS — M19071 Primary osteoarthritis, right ankle and foot: Secondary | ICD-10-CM | POA: Diagnosis not present

## 2020-06-11 DIAGNOSIS — M25571 Pain in right ankle and joints of right foot: Secondary | ICD-10-CM

## 2020-06-11 DIAGNOSIS — M7989 Other specified soft tissue disorders: Secondary | ICD-10-CM | POA: Diagnosis not present

## 2020-06-14 DIAGNOSIS — R531 Weakness: Secondary | ICD-10-CM | POA: Diagnosis not present

## 2020-06-14 DIAGNOSIS — M25671 Stiffness of right ankle, not elsewhere classified: Secondary | ICD-10-CM | POA: Diagnosis not present

## 2020-06-14 DIAGNOSIS — M7661 Achilles tendinitis, right leg: Secondary | ICD-10-CM | POA: Diagnosis not present

## 2020-06-16 DIAGNOSIS — M25671 Stiffness of right ankle, not elsewhere classified: Secondary | ICD-10-CM | POA: Diagnosis not present

## 2020-06-16 DIAGNOSIS — R531 Weakness: Secondary | ICD-10-CM | POA: Diagnosis not present

## 2020-06-16 DIAGNOSIS — M7661 Achilles tendinitis, right leg: Secondary | ICD-10-CM | POA: Diagnosis not present

## 2020-06-22 DIAGNOSIS — M25671 Stiffness of right ankle, not elsewhere classified: Secondary | ICD-10-CM | POA: Diagnosis not present

## 2020-06-27 DIAGNOSIS — R531 Weakness: Secondary | ICD-10-CM | POA: Diagnosis not present

## 2020-06-27 DIAGNOSIS — M7661 Achilles tendinitis, right leg: Secondary | ICD-10-CM | POA: Diagnosis not present

## 2020-06-27 DIAGNOSIS — M25671 Stiffness of right ankle, not elsewhere classified: Secondary | ICD-10-CM | POA: Diagnosis not present

## 2020-10-18 ENCOUNTER — Other Ambulatory Visit: Payer: Self-pay

## 2020-10-18 ENCOUNTER — Ambulatory Visit (INDEPENDENT_AMBULATORY_CARE_PROVIDER_SITE_OTHER): Payer: Medicare HMO

## 2020-10-18 DIAGNOSIS — Z23 Encounter for immunization: Secondary | ICD-10-CM | POA: Diagnosis not present

## 2021-01-12 ENCOUNTER — Other Ambulatory Visit: Payer: Self-pay

## 2021-01-13 ENCOUNTER — Telehealth: Payer: Self-pay

## 2021-01-13 ENCOUNTER — Ambulatory Visit (INDEPENDENT_AMBULATORY_CARE_PROVIDER_SITE_OTHER): Payer: Medicare HMO | Admitting: Family Medicine

## 2021-01-13 ENCOUNTER — Encounter: Payer: Self-pay | Admitting: Family Medicine

## 2021-01-13 VITALS — BP 120/77 | HR 72 | Temp 97.6°F | Ht 69.0 in | Wt 198.0 lb

## 2021-01-13 DIAGNOSIS — Z0000 Encounter for general adult medical examination without abnormal findings: Secondary | ICD-10-CM

## 2021-01-13 DIAGNOSIS — M722 Plantar fascial fibromatosis: Secondary | ICD-10-CM | POA: Insufficient documentation

## 2021-01-13 LAB — CBC WITH DIFFERENTIAL/PLATELET
Basophils Absolute: 0.1 10*3/uL (ref 0.0–0.1)
Basophils Relative: 0.9 % (ref 0.0–3.0)
Eosinophils Absolute: 0.2 10*3/uL (ref 0.0–0.7)
Eosinophils Relative: 2.5 % (ref 0.0–5.0)
HCT: 44.1 % (ref 39.0–52.0)
Hemoglobin: 14.4 g/dL (ref 13.0–17.0)
Lymphocytes Relative: 36 % (ref 12.0–46.0)
Lymphs Abs: 2.7 10*3/uL (ref 0.7–4.0)
MCHC: 32.5 g/dL (ref 30.0–36.0)
MCV: 98.5 fl (ref 78.0–100.0)
Monocytes Absolute: 1 10*3/uL (ref 0.1–1.0)
Monocytes Relative: 13.3 % — ABNORMAL HIGH (ref 3.0–12.0)
Neutro Abs: 3.5 10*3/uL (ref 1.4–7.7)
Neutrophils Relative %: 47.3 % (ref 43.0–77.0)
Platelets: 310 10*3/uL (ref 150.0–400.0)
RBC: 4.48 Mil/uL (ref 4.22–5.81)
RDW: 14 % (ref 11.5–15.5)
WBC: 7.4 10*3/uL (ref 4.0–10.5)

## 2021-01-13 LAB — COMPREHENSIVE METABOLIC PANEL
ALT: 23 U/L (ref 0–53)
AST: 30 U/L (ref 0–37)
Albumin: 4 g/dL (ref 3.5–5.2)
Alkaline Phosphatase: 28 U/L — ABNORMAL LOW (ref 39–117)
BUN: 20 mg/dL (ref 6–23)
CO2: 22 mEq/L (ref 19–32)
Calcium: 9 mg/dL (ref 8.4–10.5)
Chloride: 105 mEq/L (ref 96–112)
Creatinine, Ser: 0.94 mg/dL (ref 0.40–1.50)
GFR: 76.51 mL/min (ref >60.00)
Glucose, Bld: 101 mg/dL — ABNORMAL HIGH (ref 70–99)
Potassium: 4.4 mEq/L (ref 3.5–5.1)
Sodium: 137 mEq/L (ref 135–145)
Total Bilirubin: 1.4 mg/dL — ABNORMAL HIGH (ref 0.2–1.2)
Total Protein: 7 g/dL (ref 6.0–8.3)

## 2021-01-13 LAB — LIPID PANEL
Cholesterol: 118 mg/dL (ref 0–200)
HDL: 54.7 mg/dL (ref >39.00)
LDL Cholesterol: 51 mg/dL (ref 0–99)
NonHDL: 63.75
Total CHOL/HDL Ratio: 2
Triglycerides: 63 mg/dL (ref 0.0–149.0)
VLDL: 12.6 mg/dL (ref 0.0–40.0)

## 2021-01-13 MED ORDER — ZOSTER VAC RECOMB ADJUVANTED 50 MCG/0.5ML IM SUSR: 0.5000 mL | Freq: Once | INTRAMUSCULAR | 1 refills | Status: AC

## 2021-01-13 NOTE — Telephone Encounter (Signed)
LM for pt to return call regarding recommendations.  

## 2021-01-13 NOTE — Patient Instructions (Signed)

## 2021-01-13 NOTE — Progress Notes (Signed)
Office Note 01/13/2021  CC:  Chief Complaint  Patient presents with   Annual Exam    Pt is fasting    HPI:  Patient is a 81 y.o. male who is here for annual health maintenance exam. A/P as of last visit 1 yr ago: "Health maintenance exam: Reviewed age and gender appropriate health maintenance issues (prudent diet, regular exercise, health risks of tobacco and excessive alcohol, use of seatbelts, fire alarms in home, use of sunscreen).  Also reviewed age and gender appropriate health screening as well as vaccine recommendations. Vaccines: Shingrix->sent rx to pharmacy today.  Otherwise ALL UTD. Labs: cmet, flp. Prostate ca screening: no hx of abnormal PSAs.  No further prostate ca screening indicated due to age. Colon ca screening: normal colonoscopy 2011.  Pt has declined any further colon ca screening.   R ankle instability, chronic.  Undergoing ortho eval with MRI and ankle/foot specialist. 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 Ortho has rx'd gabapentin for this and he'll start this any day now."  INTERIM HX: Paul Knight feels well. He is not able to exercise significantly at all due to chronic right ankle instability, history of peroneal tendon tear, remote history of multiple severe ankle sprains. He is getting some rehab for this.  Patient states orthopedist has recommended against surgery because it is quite involved.    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 Tarsal tunnel syndrome.  Ortho and neuro could not determine dx.     Osteoarthritis, multiple sites    shoulder esp   Squamous cell carcinoma of skin 06/11/2016   right cheek - CX3 + 5FU    Past Surgical History:  Procedure Laterality Date   APPENDECTOMY  1970s    COLONOSCOPY  01/08/2009   Normal; recall 10 yrs   REVERSE SHOULDER ARTHROPLASTY Right 03/19/2018   Procedure: REVERSE SHOULDER ARTHROPLASTY;  Surgeon: Hiram Gash, MD;  Location: [facility];  Service: Orthopedics;  Laterality: Right;  with exparel   TONSILLECTOMY  as a child    Family History  Problem Relation Age of Onset   Heart failure Mother    Heart attack Mother        Deceased, 67   Other Father        Deceased, 7   Heart disease Brother    Healthy Son    Thyroid disease Daughter    Cancer Daughter        ovarian    Social History   Socioeconomic History   Marital status: Married    Spouse name: Not on file   Number of children: 4   Years of education: Not on file   Highest education level: Not on file  Occupational History   Occupation: Retired  Tobacco Use   Smoking status: Former    Packs/day: 0.25    Years: 10.00    Pack years: 2.50    Types: Cigarettes   Smokeless tobacco: Never   Tobacco comments:    qiut  when 81 years old  Vaping Use   Vaping Use: Never used  Substance and Sexual Activity   Alcohol use: Yes    Alcohol/week: 1.0 - 2.0 standard drink    Types: 1 -  2 Glasses of wine per week    Comment: 2 glasses of wine nightly x 5 years   Drug use: No   Sexual activity: Not Currently  Other Topics Concern   Not on file  Social History Narrative   Married, 4 children in the [place] area.  Grandchildren x 7.   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   No tobacco.  Alcohol 2 glasses wine/day.   Exercise: [employer] fitness, walking in park, stationary bike at home: 3 days per week for 1 hour.   Social Determinants of Health   Financial Resource Strain: Low Risk    Difficulty of Paying Living Expenses: Not hard at all  Food Insecurity: No Food Insecurity   Worried About Charity fundraiser in the Last Year: Never true   [place] in the Last Year: Never true  Transportation Needs: No Transportation Needs   Lack of Transportation (Medical): No    Lack of Transportation (Non-Medical): No  Physical Activity: Insufficiently Active   Days of Exercise per Week: 2 days   Minutes of Exercise per Session: 30 min  Stress: No Stress Concern Present   Feeling of Stress : Not at all  Social Connections: Moderately Integrated   Frequency of Communication with Friends and Family: More than three times a week   Frequency of Social Gatherings with Friends and Family: More than three times a week   Attends Religious Services: More than 4 times per year   Active Member of [employer] or Organizations: No   Attends Archivist Meetings: Never   Marital Status: Married  Human resources officer Violence: Not At Risk   Fear of Current or Ex-Partner: No   Emotionally Abused: No   Physically Abused: No   Sexually Abused: No    Outpatient Medications Prior to Visit  Medication Sig Dispense Refill   naproxen (NAPROSYN) 250 MG tablet Take by mouth 2 (two) times daily with a meal. (Patient not taking: Reported on 01/13/2021)     tadalafil (CIALIS) 10 MG tablet 1-2 tabs po qd prn intercourse.  Take 30 min prior to intercourse. (Patient not taking: Reported on 01/14/2020) 10 tablet 0   No facility-administered medications prior to visit.    No Known Allergies  ROS Review of Systems  Constitutional:  Negative for appetite change, chills, fatigue and fever.  HENT:  Negative for congestion, dental problem, ear pain and sore throat.   Eyes:  Negative for discharge, redness and visual disturbance.  Respiratory:  Negative for cough, chest tightness, shortness of breath and wheezing.   Cardiovascular:  Negative for chest pain, palpitations and leg swelling.  Gastrointestinal:  Negative for abdominal pain, blood in stool, diarrhea, nausea and vomiting.  Genitourinary:  Negative for difficulty urinating, dysuria, flank pain, frequency, hematuria and urgency.  Musculoskeletal:  Negative for arthralgias, back pain, joint swelling, myalgias and neck stiffness.   Skin:  Negative for pallor and rash.  Neurological:  Negative for dizziness, speech difficulty, weakness and headaches.  Hematological:  Negative for adenopathy. Does not bruise/bleed easily.  Psychiatric/Behavioral:  Negative for confusion and sleep disturbance. The patient is not nervous/anxious.    PE; Vitals with BMI 01/13/2021 06/08/2020 01/14/2020  Height 5\' 9"  5' 7.5" 5' 7.5"  Weight 198 lbs 194 lbs 196 lbs 13 oz  BMI 29.23 79.89 21.19  Systolic 417 408 144  Diastolic 77 76 83  Pulse 72 68 85     Gen: Alert, well appearing.  Patient is oriented to person,  place, time, and situation. AFFECT: pleasant, lucid thought and speech. ENT: Ears: EACs clear, normal epithelium.  TMs with good light reflex and landmarks bilaterally.  Eyes: no injection, icteris, swelling, or exudate.  EOMI, PERRLA. Nose: no drainage or turbinate edema/swelling.  No injection or focal lesion.  Mouth: lips without lesion/swelling.  Oral mucosa pink and moist.  Dentition intact and without obvious caries or gingival swelling.  Oropharynx without erythema, exudate, or swelling.  Neck: supple/nontender.  No LAD, mass, or TM.  Carotid pulses 2+ bilaterally, without bruits. CV: RRR, no m/r/g.   LUNGS: CTA bilat, nonlabored resps, good aeration in all lung fields. ABD: soft, NT, ND, BS normal.  No hepatospenomegaly or mass.  No bruits. EXT: no clubbing, cyanosis, or edema.  Musculoskeletal: no joint swelling, erythema, warmth, or tenderness.  ROM of all joints intact. Skin - no sores or suspicious lesions or rashes or color changes  Pertinent labs:  Lab Results  Component Value Date   TSH 1.20 05/20/2015   Lab Results  Component Value Date   WBC 9.5 07/12/2019   HGB 13.9 07/12/2019   HCT 41.1 07/12/2019   MCV 99.0 07/12/2019   PLT 296 07/12/2019   Lab Results  Component Value Date   CREATININE 1.01 01/14/2020   BUN 20 01/14/2020   NA 138 01/14/2020   K 4.5 01/14/2020   CL 104 01/14/2020   CO2 28  01/14/2020   Lab Results  Component Value Date   ALT 19 01/14/2020   AST 25 01/14/2020   ALKPHOS 30 (L) 01/14/2020   BILITOT 1.4 (H) 01/14/2020   Lab Results  Component Value Date   CHOL 120 01/14/2020   Lab Results  Component Value Date   HDL 55.90 01/14/2020   Lab Results  Component Value Date   LDLCALC 54 01/14/2020   Lab Results  Component Value Date   TRIG 52.0 01/14/2020   Lab Results  Component Value Date   CHOLHDL 2 01/14/2020   Lab Results  Component Value Date   PSA 0.44 02/27/2013   PSA 0.43 08/09/2010   PSA 0.39 08/05/2009   ASSESSMENT AND PLAN:   Health maintenance exam: Reviewed age and gender appropriate health maintenance issues (prudent diet, regular exercise, health risks of tobacco and excessive alcohol, use of seatbelts, fire alarms in home, use of sunscreen).  Also reviewed age and gender appropriate health screening as well as vaccine recommendations. Vaccines: shingrix->rx to pharmacy today.  Otherwise all utd. Labs:  cbc, lipids, cmet Prostate ca screening: no hx of abnormal PSAs.  No further prostate ca screening indicated due to age. Colon ca screening: normal colonoscopy 2011.  Pt has declined any further colon ca screening.  An After Visit Summary was printed and given to the patient.  FOLLOW UP:  No follow-ups on file.  Signed:  Crissie Sickles, MD           01/13/2021

## 2021-01-13 NOTE — Telephone Encounter (Signed)
Pt would like to know of alternatives he can take besides naproxen for pain. He does not want to take something that could potentially cause GI issues.

## 2021-01-13 NOTE — Telephone Encounter (Signed)
Tylenol 500 mg tabs, 1-2 every 6 hours as needed.

## 2021-01-16 NOTE — Telephone Encounter (Signed)
LM for pt to returncall

## 2021-01-17 NOTE — Telephone Encounter (Signed)
Pt advised of med recommendations. 

## 2021-06-08 ENCOUNTER — Telehealth: Payer: Self-pay

## 2021-06-08 NOTE — Telephone Encounter (Signed)
Called pt to schedule AWV. Please schedule with health coach, Toria or Jemario Poitras. ? ?

## 2021-06-13 ENCOUNTER — Telehealth: Payer: Self-pay | Admitting: Family Medicine

## 2021-06-13 NOTE — Telephone Encounter (Signed)
Left message for patient to schedule Annual Wellness Visit.  Please schedule (telephone/video call) with Nurse Health Advisor Tina Betterson, RN at [place]. Please call [phone] ask for Kathy 

## 2021-07-12 ENCOUNTER — Ambulatory Visit (INDEPENDENT_AMBULATORY_CARE_PROVIDER_SITE_OTHER): Payer: Medicare HMO

## 2021-07-12 DIAGNOSIS — Z0000 Encounter for general adult medical examination without abnormal findings: Secondary | ICD-10-CM | POA: Diagnosis not present

## 2021-07-12 NOTE — Progress Notes (Signed)
Virtual Visit via Telephone Note  I connected with  Paul Knight on 07/12/21 at  9:30 AM EDT by telephone and verified that I am speaking with the correct person using two identifiers.  Medicare Annual Wellness visit completed telephonically due to Covid-19 pandemic.   Persons participating in this call: This Health Coach and this patient.   Location: Patient: Home Provider: Office   I discussed the limitations, risks, security and privacy concerns of performing an evaluation and management service by telephone and the availability of in person appointments. The patient expressed understanding and agreed to proceed.  Unable to perform video visit due to video visit attempted and failed and/or patient does not have video capability.   Some vital signs may be absent or patient reported.   Willette Brace, LPN   Subjective:   Paul Knight is a 81 y.o. male who presents for Medicare Annual/Subsequent preventive examination.  Review of Systems     Cardiac Risk Factors include: advanced age (>19mn, >>90women);male gender     Objective:    There were no vitals filed for this visit. There is no height or weight on file to calculate BMI.     07/12/2021    9:26 AM 06/08/2020   11:21 AM 07/12/2019    7:38 PM 03/19/2018   12:20 PM 03/19/2018    6:40 AM 03/12/2018   11:05 AM 05/28/2017    8:27 AM  Advanced Directives  Does Patient Have a Medical Advance Directive? Yes Yes No Yes Yes Yes Yes  Type of AIndustrial/product designerof [place]unk[place]Living will [place]Living will [place]Friday HarborLiving will [place] Does patient want to make changes to medical advance directive?    No - Patient declined  No - Patient declined   Copy of [place] Chart? No - copy requested Yes - validated most recent copy scanned in chart (See row information)  No - copy  requested No - copy requested No - copy requested   Would patient like information on creating a medical advance directive?   No - Patient declined    Yes (MAU/Ambulatory/Procedural Areas - Information given)    Current Medications (verified) Outpatient Encounter Medications as of 07/12/2021  Medication Sig   naproxen (NAPROSYN) 250 MG tablet Take by mouth 2 (two) times daily with a meal. (Patient not taking: Reported on 01/13/2021)   tadalafil (CIALIS) 10 MG tablet 1-2 tabs po qd prn intercourse.  Take 30 min prior to intercourse. (Patient not taking: Reported on 01/14/2020)   No facility-administered encounter medications on file as of 07/12/2021.    Allergies (verified) Patient has no known allergies.   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 Tarsal tunnel syndrome.  Ortho and neuro could not determine dx.     Osteoarthritis, multiple sites    shoulder esp   Peroneal tendon rupture    right   Squamous cell carcinoma of skin 06/11/2016   right cheek - CX3 + 5FU   Past Surgical History:  Procedure Laterality Date   APPENDECTOMY  1970s   COLONOSCOPY  01/08/2009   Normal; recall 10 yrs   REVERSE SHOULDER ARTHROPLASTY Right 03/19/2018  Procedure: REVERSE SHOULDER ARTHROPLASTY;  Surgeon: Hiram Gash, MD;  Location: [facility];  Service: Orthopedics;  Laterality: Right;  with exparel   TONSILLECTOMY  as a child   Family History  Problem Relation Age of Onset   Heart failure Mother    Heart attack Mother        Deceased, 8   Other Father        Deceased, 41   Heart disease Brother    Healthy Son    Thyroid disease Daughter    Cancer Daughter        ovarian   Social History   Socioeconomic History   Marital status: Married    Spouse name: Not on file   Number of children: 4   Years of education:  Not on file   Highest education level: Not on file  Occupational History   Occupation: Retired  Tobacco Use   Smoking status: Former    Packs/day: 0.25    Years: 10.00    Total pack years: 2.50    Types: Cigarettes   Smokeless tobacco: Never   Tobacco comments:    qiut  when 81 years old  Vaping Use   Vaping Use: Never used  Substance and Sexual Activity   Alcohol use: Yes    Alcohol/week: 1.0 - 2.0 standard drink of alcohol    Types: 1 - 2 Glasses of wine per week    Comment: 2 glasses of wine nightly x 5 years   Drug use: No   Sexual activity: Not Currently  Other Topics Concern   Not on file  Social History Narrative   Married, 4 children in the [place] area.  Grandchildren x 7.   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   No tobacco.  Alcohol 2 glasses wine/day.   Exercise: [employer] fitness, walking in park, stationary bike at home: 3 days per week for 1 hour.   Social Determinants of Health   Financial Resource Strain: Low Risk  (07/12/2021)   Overall Financial Resource Strain (CARDIA)    Difficulty of Paying Living Expenses: Not hard at all  Food Insecurity: No Food Insecurity (07/12/2021)   Hunger Vital Sign    Worried About Running Out of Food in the Last Year: Never true    Ran Out of Food in the Last Year: Never true  Transportation Needs: No Transportation Needs (07/12/2021)   PRAPARE - Hydrologist (Medical): No    Lack of Transportation (Non-Medical): No  Physical Activity: Insufficiently Active (07/12/2021)   Exercise Vital Sign    Days of Exercise per Week: 3 days    Minutes of Exercise per Session: 30 min  Stress: No Stress Concern Present (07/12/2021)   [place]    Feeling of Stress : Not at all  Social Connections: Moderately Integrated (07/12/2021)   Social Connection and Isolation Panel [NHANES]    Frequency of Communication with Friends and Family: More than three  times a week    Frequency of Social Gatherings with Friends and Family: More than three times a week    Attends Religious Services: More than 4 times per year    Active Member of [employer] or Organizations: No    Attends Archivist Meetings: Never    Marital Status: Married    Tobacco Counseling Counseling given: Not Answered Tobacco comments: qiut  when 81 years old   Clinical Intake:  Pre-visit preparation completed: Yes  Pain : No/denies pain     BMI - recorded: 29.24 Nutritional Status: BMI 25 -29 Overweight Nutritional Risks: None Diabetes: No  How often do you need to have someone help you when you read instructions, pamphlets, or other written materials from your doctor or pharmacy?: 1 - Never  Diabetic?no  Interpreter Needed?: No  Information entered by :: Charlott Rakes, LPN   Activities of Daily Living    07/12/2021    9:28 AM  In your present state of health, do you have any difficulty performing the following activities:  Hearing? 1  Comment slight loss and tinnitus  Vision? 0  Difficulty concentrating or making decisions? 0  Walking or climbing stairs? 1  Comment right foot pain but take my time  Dressing or bathing? 0  Doing errands, shopping? 0  Preparing Food and eating ? N  Using the Toilet? N  In the past six months, have you accidently leaked urine? N  Do you have problems with loss of bowel control? N  Managing your Medications? N  Managing your Finances? N  Housekeeping or managing your Housekeeping? N    Patient Care Team: Tammi Sou, MD as PCP - General (Family Medicine) Alda Berthold, DO as Consulting Physician (Neurology) Berle Mull, MD as Consulting Physician (Sports Medicine) Lavonna Monarch, MD as Consulting Physician (Dermatology) Elsie Saas, MD as Consulting Physician (Orthopedic Surgery) Hiram Gash, MD as Consulting Physician (Orthopedic Surgery)  Indicate any recent [place] you may have  received from other than [facility] providers in the past year (date may be approximate).     Assessment:   This is a routine wellness examination for [place].  Hearing/Vision screen Hearing Screening - Comments:: Pt stated slight hearing loss and has tinnitus  Vision Screening - Comments:: Pt follows up with [place] care Dr Oswaldo Conroy   Dietary issues and exercise activities discussed: Current Exercise Habits: Home exercise routine, Type of exercise: walking;Other - see comments (yard work), Time (Minutes): 30, Frequency (Times/Week): 3, Weekly Exercise (Minutes/Week): 90   Goals Addressed             This Visit's Progress    Patient Stated       Maintain exercise        Depression Screen    07/12/2021    9:25 AM 01/13/2021    9:01 AM 06/08/2020   11:24 AM 01/14/2020    9:21 AM 05/28/2017   11:04 AM 05/28/2017    8:29 AM 05/21/2016    8:59 AM  PHQ 2/9 Scores  PHQ - 2 Score 0 0 0 0 0 0 0  PHQ- 9 Score     1      Fall Risk    07/12/2021    9:27 AM 01/13/2021    9:01 AM 06/08/2020   11:23 AM 01/14/2020    9:21 AM 05/28/2017    8:29 AM  Fall Risk   Falls in the past year? 0 0 1 1 Yes  Number falls in past yr: 0 0 0 1 2 or more  Injury with Fall? 0 0 0 0 No  Risk Factor Category      High Fall Risk  Risk for fall due to : Impaired vision;Impaired balance/gait;Impaired mobility    Other (Comment)  Risk for fall due to: Comment after tendon rupture    weak right ankle d/t stretched tendon  Follow up Falls prevention discussed Falls evaluation completed Falls prevention discussed Falls evaluation completed Falls prevention discussed  FALL RISK PREVENTION PERTAINING TO THE HOME:  Any stairs in or around the home? Yes  If so, are there any without handrails? No  Home free of loose throw rugs in walkways, pet beds, electrical cords, etc? Yes  Adequate lighting in your home to reduce risk of falls? Yes   ASSISTIVE DEVICES UTILIZED TO PREVENT FALLS:  Life alert? No  Use of a cane, walker  or w/c? No  Grab bars in the bathroom? Yes  Shower chair or bench in shower? Yes  Elevated toilet seat or a handicapped toilet? Yes   TIMED UP AND GO:  Was the test performed? No .    Cognitive Function:Declined at this time     05/28/2017    8:30 AM  MMSE - Mini Mental State Exam  Orientation to time 5  Orientation to Place 5  Registration 3  Attention/ Calculation 5  Recall 2  Language- name 2 objects 2  Language- repeat 1  Language- follow 3 step command 3  Language- read & follow direction 1  Write a sentence 1  Copy design 1  Total score 29        06/08/2020   11:29 AM  6CIT Screen  What Year? 0 points  What month? 0 points  What time? 0 points  Count back from 20 0 points  Months in reverse 2 points  Repeat phrase 2 points  Total Score 4 points    Immunizations Immunization History  Administered Date(s) Administered   Fluad Quad(high Dose 65+) 09/26/2018, 10/07/2019, 10/18/2020   Influenza, High Dose Seasonal PF 09/16/2015, 10/04/2016, 10/21/2017   Influenza,inj,Quad PF,6+ Mos 10/12/2013, 10/06/2014   PFIZER(Purple Top)SARS-COV-2 Vaccination 01/23/2019, 02/13/2019, 11/16/2019, 05/06/2020   Pfizer Covid-19 Vaccine Bivalent Booster 108yr & up 10/28/2020   Pneumococcal Conjugate-13 05/20/2014   Pneumococcal Polysaccharide-23 09/17/2006   Td 08/09/2002   Tdap 02/09/2008, 05/14/2013   Zoster, Live 08/09/2010    TDAP status: Up to date  Flu Vaccine status: Up to date  Pneumococcal vaccine status: Up to date  Covid-19 vaccine status: Completed vaccines  Qualifies for Shingles Vaccine? Yes   Zostavax completed No   Shingrix Completed?: No.    Education has been provided regarding the importance of this vaccine. Patient has been advised to call insurance company to determine out of pocket expense if they have not yet received this vaccine. Advised may also receive vaccine at local pharmacy or Health Dept. Verbalized acceptance and  understanding.  Screening Tests Health Maintenance  Topic Date Due   Zoster Vaccines- Shingrix (1 of 2) Never done   COVID-19 Vaccine (6 - Pfizer series) 02/28/2021   INFLUENZA VACCINE  08/08/2021   TETANUS/TDAP  05/15/2023   Pneumonia Vaccine 81 Years old  Completed   HPV VACCINES  Aged Out    Health Maintenance  Health Maintenance Due  Topic Date Due   Zoster Vaccines- Shingrix (1 of 2) Never done   COVID-19 Vaccine (6 - Pfizer series) 02/28/2021    Colorectal cancer screening: No longer required.   Lung Cancer Screening: (Low Dose CT Chest recommended if Age 81-80years, 30 pack-year currently smoking OR have quit w/in 15years.) does not qualify.   Additional Screening:   Vision Screening: Recommended annual ophthalmology exams for early detection of glaucoma and other disorders of the eye. Is the patient up to date with their annual eye exam?  Yes  Who is the provider or what is the name of the office in which the patient attends annual eye exams?  Dr Oswaldo Conroy  If pt is not established with a provider, would they like to be referred to a provider to establish care? No .   Dental Screening: Recommended annual dental exams for proper oral hygiene  Community Resource Referral / Chronic Care Management: CRR required this visit?  No   CCM required this visit?  No      Plan:     I have personally reviewed and noted the following in the patient's chart:   Medical and social history Use of alcohol, tobacco or illicit drugs  Current medications and supplements including opioid prescriptions. Patient is not currently taking opioid prescriptions. Functional ability and status Nutritional status Physical activity Advanced directives List of other physicians Hospitalizations, surgeries, and ER visits in previous 12 months Vitals Screenings to include cognitive, depression, and falls Referrals and appointments  In addition, I have reviewed and discussed with patient  certain preventive protocols, quality metrics, and best practice recommendations. A written personalized care plan for preventive services as well as general preventive health recommendations were provided to patient.     Willette Brace, LPN   08/14/5782   Nurse Notes: None

## 2021-07-12 NOTE — Patient Instructions (Signed)
[name] , Thank you for taking time to come for your Medicare Wellness Visit. I appreciate your ongoing commitment to your health goals. Please review the following plan we discussed and let me know if I can assist you in the future.   Screening recommendations/referrals: Colonoscopy: no longer required  Recommended yearly ophthalmology/optometry visit for glaucoma screening and checkup Recommended yearly dental visit for hygiene and checkup  Vaccinations: Influenza vaccine: one 10/18/20 repeat every year  Pneumococcal vaccine: Up to date Tdap vaccine: Done 05/14/13 repeat every 10 years  Shingles vaccine: Shingrix discussed. Please contact your pharmacy for coverage information.    Covid-19: Completed 1/15, 2/5, 11/16/19 4/29, 10/28/20  Advanced directives: Please bring a copy of your health care power of attorney and living will to the office at your convenience.  Conditions/risks identified: increase exercise   Next appointment: Follow up in one year for your annual wellness visit.   Preventive Care 22 Years and Older, Male Preventive care refers to lifestyle choices and visits with your health care provider that can promote health and wellness. What does preventive care include? A yearly physical exam. This is also called an annual well check. Dental exams once or twice a year. Routine eye exams. Ask your health care provider how often you should have your eyes checked. Personal lifestyle choices, including: Daily care of your teeth and gums. Regular physical activity. Eating a healthy diet. Avoiding tobacco and drug use. Limiting alcohol use. Practicing safe sex. Taking low doses of aspirin every day. Taking vitamin and mineral supplements as recommended by your health care provider. What happens during an annual well check? The services and screenings done by your health care provider during your annual well check will depend on your age, overall health, lifestyle risk  factors, and family history of disease. Counseling  Your health care provider may ask you questions about your: Alcohol use. Tobacco use. Drug use. Emotional well-being. Home and relationship well-being. Sexual activity. Eating habits. History of falls. Memory and ability to understand (cognition). Work and work Statistician. Screening  You may have the following tests or measurements: Height, weight, and BMI. Blood pressure. Lipid and cholesterol levels. These may be checked every 5 years, or more frequently if you are over 81 years old. Skin check. Lung cancer screening. You may have this screening every year starting at age 81 if you have a 30-pack-year history of smoking and currently smoke or have quit within the past 15 years. Fecal occult blood test (FOBT) of the stool. You may have this test every year starting at age 81. Flexible sigmoidoscopy or colonoscopy. You may have a sigmoidoscopy every 5 years or a colonoscopy every 10 years starting at age 81. Prostate cancer screening. Recommendations will vary depending on your family history and other risks. Hepatitis C blood test. Hepatitis B blood test. Sexually transmitted disease (STD) testing. Diabetes screening. This is done by checking your blood sugar (glucose) after you have not eaten for a while (fasting). You may have this done every 1-3 years. Abdominal aortic aneurysm (AAA) screening. You may need this if you are a current or former smoker. Osteoporosis. You may be screened starting at age 81 if you are at high risk. Talk with your health care provider about your test results, treatment options, and if necessary, the need for more tests. Vaccines  Your health care provider may recommend certain vaccines, such as: Influenza vaccine. This is recommended every year. Tetanus, diphtheria, and acellular pertussis (Tdap, Td) vaccine. You may  need a Td booster every 10 years. Zoster vaccine. You may need this after age  81. Pneumococcal 13-valent conjugate (PCV13) vaccine. One dose is recommended after age 81. Pneumococcal polysaccharide (PPSV23) vaccine. One dose is recommended after age 81. Talk to your health care provider about which screenings and vaccines you need and how often you need them. This information is not intended to replace advice given to you by your health care provider. Make sure you discuss any questions you have with your health care provider. Document Released: 01/21/2015 Document Revised: 09/14/2015 Document Reviewed: 10/26/2014 Elsevier Interactive Patient Education  2017 [place] Prevention in the Home Falls can cause injuries. They can happen to people of all ages. There are many things you can do to make your home safe and to help prevent falls. What can I do on the outside of my home? Regularly fix the edges of walkways and driveways and fix any cracks. Remove anything that might make you trip as you walk through a door, such as a raised step or threshold. Trim any bushes or trees on the path to your home. Use bright outdoor lighting. Clear any walking paths of anything that might make someone trip, such as rocks or tools. Regularly check to see if handrails are loose or broken. Make sure that both sides of any steps have handrails. Any raised decks and porches should have guardrails on the edges. Have any leaves, snow, or ice cleared regularly. Use sand or salt on walking paths during winter. Clean up any spills in your garage right away. This includes oil or grease spills. What can I do in the bathroom? Use night lights. Install grab bars by the toilet and in the tub and shower. Do not use towel bars as grab bars. Use non-skid mats or decals in the tub or shower. If you need to sit down in the shower, use a plastic, non-slip stool. Keep the floor dry. Clean up any water that spills on the floor as soon as it happens. Remove soap buildup in the tub or shower  regularly. Attach bath mats securely with double-sided non-slip rug tape. Do not have throw rugs and other things on the floor that can make you trip. What can I do in the bedroom? Use night lights. Make sure that you have a light by your bed that is easy to reach. Do not use any sheets or blankets that are too big for your bed. They should not hang down onto the floor. Have a firm chair that has side arms. You can use this for support while you get dressed. Do not have throw rugs and other things on the floor that can make you trip. What can I do in the kitchen? Clean up any spills right away. Avoid walking on wet floors. Keep items that you use a lot in easy-to-reach places. If you need to reach something above you, use a strong step stool that has a grab bar. Keep electrical cords out of the way. Do not use floor polish or wax that makes floors slippery. If you must use wax, use non-skid floor wax. Do not have throw rugs and other things on the floor that can make you trip. What can I do with my stairs? Do not leave any items on the stairs. Make sure that there are handrails on both sides of the stairs and use them. Fix handrails that are broken or loose. Make sure that handrails are as long as the stairways.  Check any carpeting to make sure that it is firmly attached to the stairs. Fix any carpet that is loose or worn. Avoid having throw rugs at the top or bottom of the stairs. If you do have throw rugs, attach them to the floor with carpet tape. Make sure that you have a light switch at the top of the stairs and the bottom of the stairs. If you do not have them, ask someone to add them for you. What else can I do to help prevent falls? Wear shoes that: Do not have high heels. Have rubber bottoms. Are comfortable and fit you well. Are closed at the toe. Do not wear sandals. If you use a stepladder: Make sure that it is fully opened. Do not climb a closed stepladder. Make sure that  both sides of the stepladder are locked into place. Ask someone to hold it for you, if possible. Clearly mark and make sure that you can see: Any grab bars or handrails. First and last steps. Where the edge of each step is. Use tools that help you move around (mobility aids) if they are needed. These include: Canes. Walkers. Scooters. Crutches. Turn on the lights when you go into a dark area. Replace any light bulbs as soon as they burn out. Set up your furniture so you have a clear path. Avoid moving your furniture around. If any of your floors are uneven, fix them. If there are any pets around you, be aware of where they are. Review your medicines with your doctor. Some medicines can make you feel dizzy. This can increase your chance of falling. Ask your doctor what other things that you can do to help prevent falls. This information is not intended to replace advice given to you by your health care provider. Make sure you discuss any questions you have with your health care provider. Document Released: 10/21/2008 Document Revised: 06/02/2015 Document Reviewed: 01/29/2014 Elsevier Interactive Patient Education  2017 [employer].

## 2021-09-15 ENCOUNTER — Ambulatory Visit (INDEPENDENT_AMBULATORY_CARE_PROVIDER_SITE_OTHER): Payer: Medicare HMO

## 2021-09-15 DIAGNOSIS — Z23 Encounter for immunization: Secondary | ICD-10-CM | POA: Diagnosis not present

## 2021-10-22 IMAGING — CT CT ANKLE*R* W/O CM
1 series · 12 of 14 positions shown, 15 images · non-contrast
Comparison: MRI 01/15/2020

CLINICAL DATA: Right ankle pain and swelling for 2 months. No
specific injury.

EXAM:
CT OF THE RIGHT ANKLE WITHOUT CONTRAST
TECHNIQUE: Multidetector CT imaging of the right ankle was performed according
to the standard protocol. Multiplanar CT image reconstructions were
also generated.

[Series 4: soft tissue lower extremity · axial · 0.37mm/px · z∈[-1172,-974]mm · 12 of 117 slices shown, 15 images]
[im 9/117  soft-tissue]
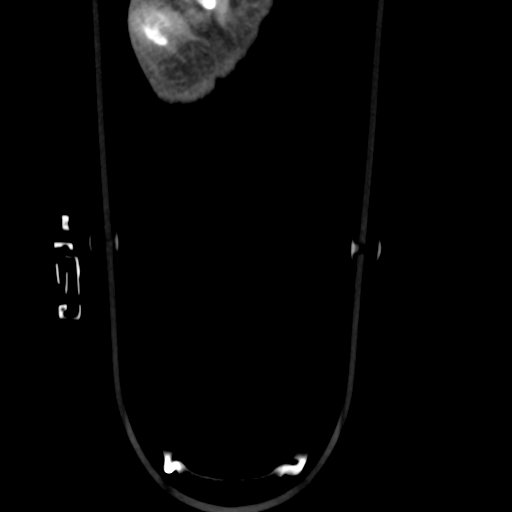
[im 9/117  bone]
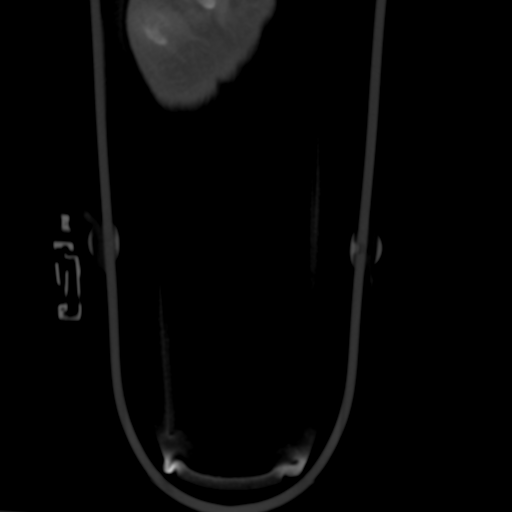
[im 18/117  bone]
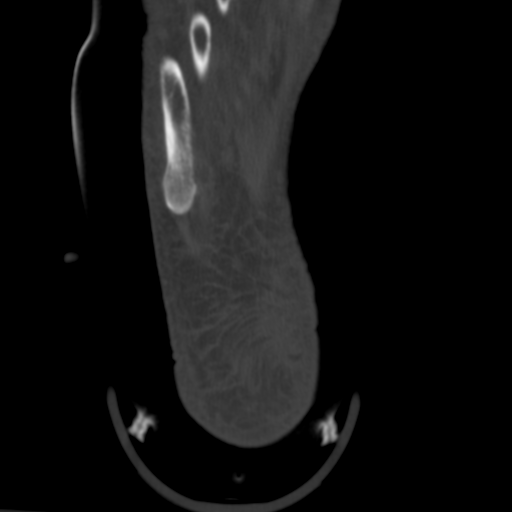
[im 27/117  bone]
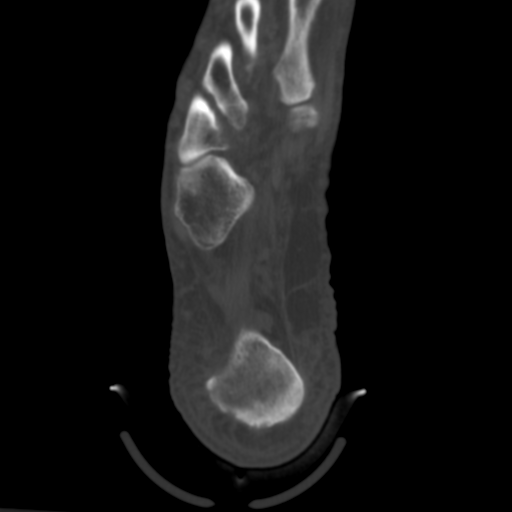
[im 36/117  bone]
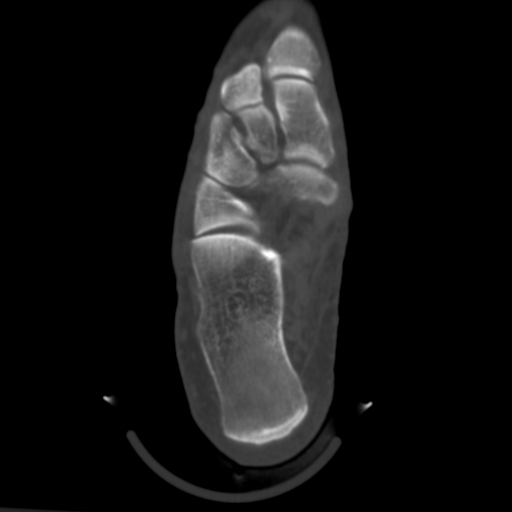
[im 45/117  soft-tissue]
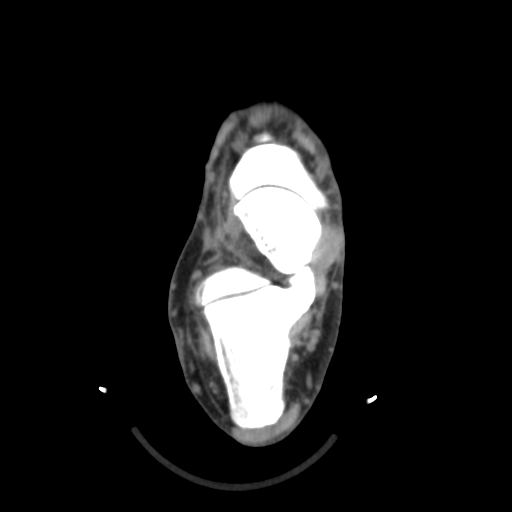
[im 45/117  bone]
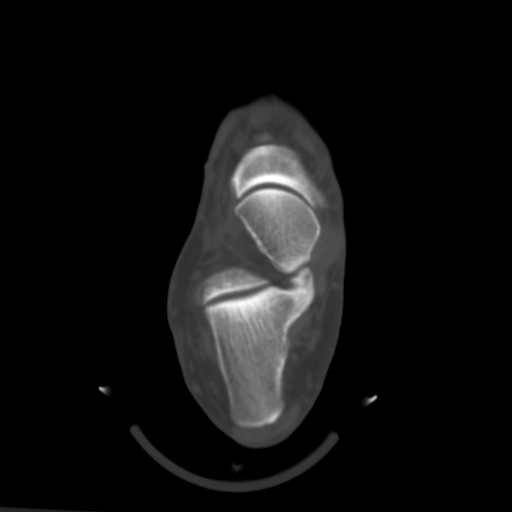
[im 54/117  bone]
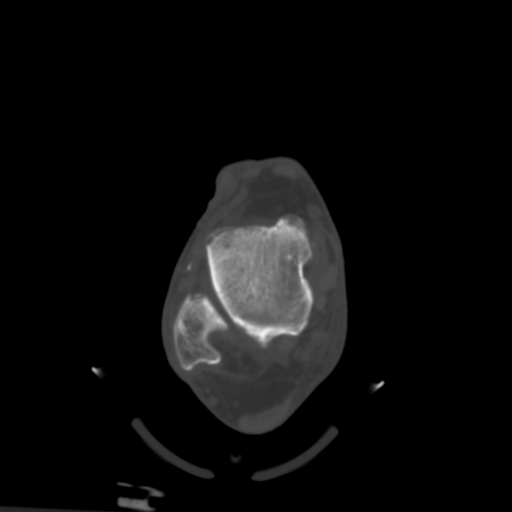
[im 63/117  bone]
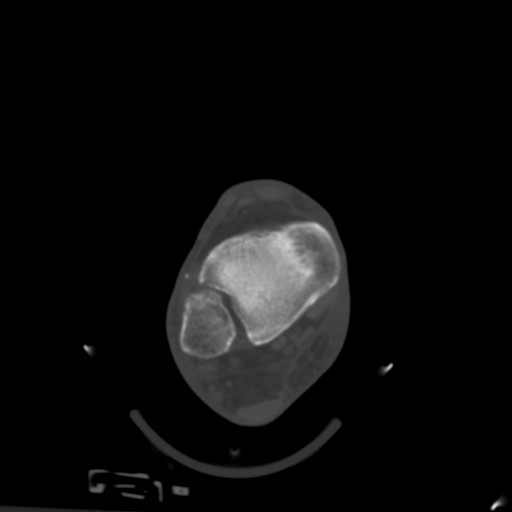
[im 72/117  bone]
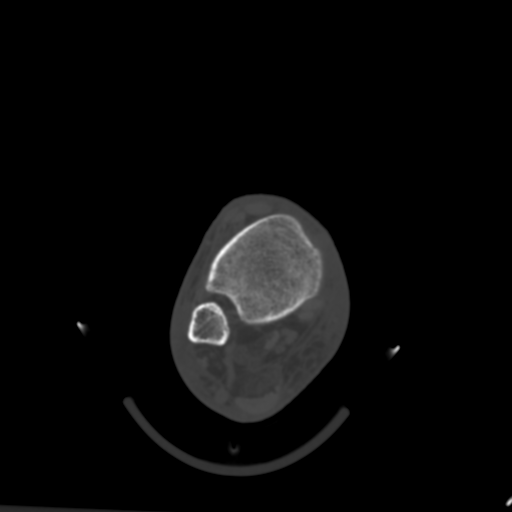
[im 81/117  soft-tissue]
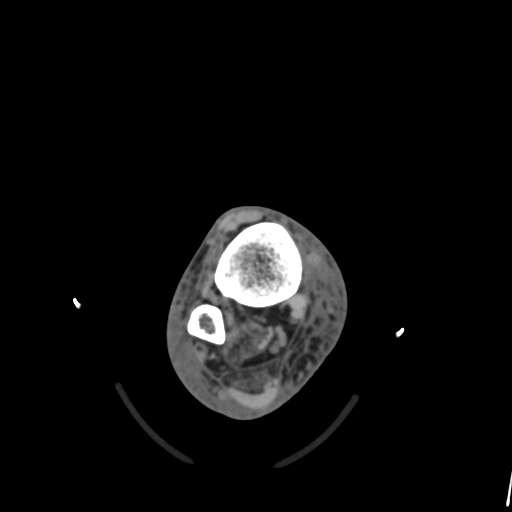
[im 81/117  bone]
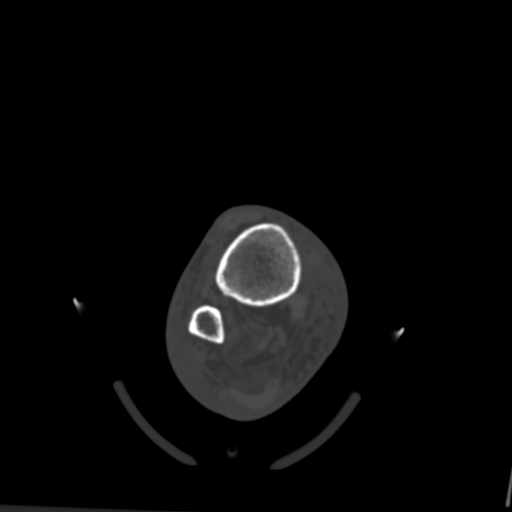
[im 90/117  bone]
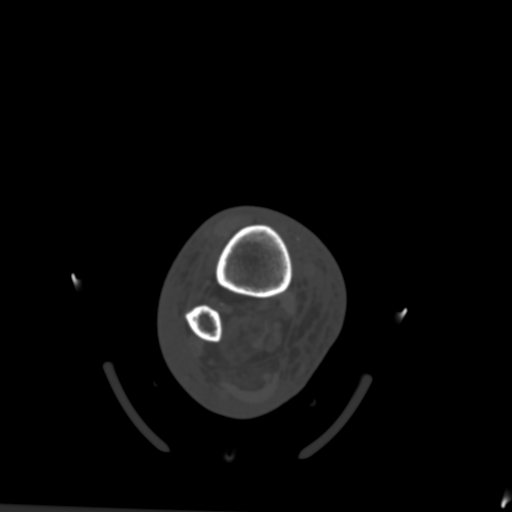
[im 99/117  bone]
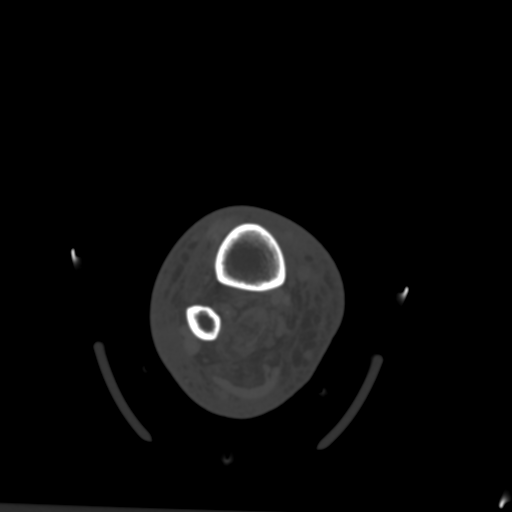
[im 108/117  bone]
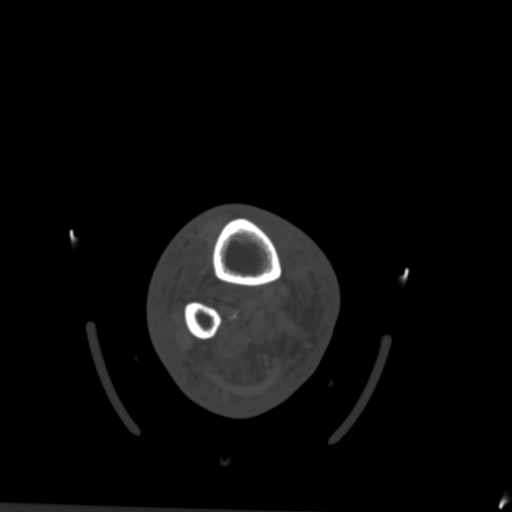

[12 of 14 positions shown; findings below may reference images not displayed]

FINDINGS: Moderate ankle joint degenerative changes with joint space
narrowing, degenerative chondrosis and mild spurring. No joint
effusion or osteochondral lesion. Moderate subtalar joint
degenerative changes are also noted. The sinus tarsi is grossly
normal.

The visualized mid and hindfoot bony structures are intact. No
fractures or bone lesions. The joint spaces are fairly well
maintained. Mild age related degenerative changes.

Mild spurring change at the plantar fascia attachment on the
calcaneus.

As demonstrated on the MRI the peroneus longus and brevis tendons
are completely torn and retracted. The anterior and medial ankle
tendons are intact. Grossly by CT the medial and lateral ankle
ligaments are intact. The Achilles tendon is intact. No evidence of
plantar fascia rupture.

Moderate diffuse fatty atrophy of the foot and ankle musculature.

Mild subcutaneous soft tissue swelling/edema. Vascular
calcifications are noted.
IMPRESSION: 1. Moderate ankle and subtalar joint degenerative changes.
2. Chronically torn peroneus longus and brevis tendons.
3. Moderate diffuse fatty atrophy of the foot and ankle
musculature.
4. No acute bony findings or worrisome bone lesions.

## 2022-01-15 NOTE — Patient Instructions (Signed)
Health Maintenance, Male Adopting a healthy lifestyle and getting preventive care are important in promoting health and wellness. Ask your health care provider about: The right schedule for you to have regular tests and exams. Things you can do on your own to prevent diseases and keep yourself healthy. What should I know about diet, weight, and exercise? Eat a healthy diet  Eat a diet that includes plenty of vegetables, fruits, low-fat dairy products, and lean protein. Do not eat a lot of foods that are high in solid fats, added sugars, or sodium. Maintain a healthy weight Body mass index (BMI) is a measurement that can be used to identify possible weight problems. It estimates body fat based on height and weight. Your health care provider can help determine your BMI and help you achieve or maintain a healthy weight. Get regular exercise Get regular exercise. This is one of the most important things you can do for your health. Most adults should: Exercise for at least 150 minutes each week. The exercise should increase your heart rate and make you sweat (moderate-intensity exercise). Do strengthening exercises at least twice a week. This is in addition to the moderate-intensity exercise. Spend less time sitting. Even light physical activity can be beneficial. Watch cholesterol and blood lipids Have your blood tested for lipids and cholesterol at 82 years of age, then have this test every 5 years. You may need to have your cholesterol levels checked more often if: Your lipid or cholesterol levels are high. You are older than 82 years of age. You are at high risk for heart disease. What should I know about cancer screening? Many types of cancers can be detected early and may often be prevented. Depending on your health history and family history, you may need to have cancer screening at various ages. This may include screening for: Colorectal cancer. Prostate cancer. Skin cancer. Lung  cancer. What should I know about heart disease, diabetes, and high blood pressure? Blood pressure and heart disease High blood pressure causes heart disease and increases the risk of stroke. This is more likely to develop in people who have high blood pressure readings or are overweight. Talk with your health care provider about your target blood pressure readings. Have your blood pressure checked: Every 3-5 years if you are 18-39 years of age. Every year if you are 40 years old or older. If you are between the ages of 65 and 75 and are a current or former smoker, ask your health care provider if you should have a one-time screening for abdominal aortic aneurysm (AAA). Diabetes Have regular diabetes screenings. This checks your fasting blood sugar level. Have the screening done: Once every three years after age 45 if you are at a normal weight and have a low risk for diabetes. More often and at a younger age if you are overweight or have a high risk for diabetes. What should I know about preventing infection? Hepatitis B If you have a higher risk for hepatitis B, you should be screened for this virus. Talk with your health care provider to find out if you are at risk for hepatitis B infection. Hepatitis C Blood testing is recommended for: Everyone born from 1945 through 1965. Anyone with known risk factors for hepatitis C. Sexually transmitted infections (STIs) You should be screened each year for STIs, including gonorrhea and chlamydia, if: You are sexually active and are younger than 82 years of age. You are older than 82 years of age and your   health care provider tells you that you are at risk for this type of infection. Your sexual activity has changed since you were last screened, and you are at increased risk for chlamydia or gonorrhea. Ask your health care provider if you are at risk. Ask your health care provider about whether you are at high risk for HIV. Your health care provider  may recommend a prescription medicine to help prevent HIV infection. If you choose to take medicine to prevent HIV, you should first get tested for HIV. You should then be tested every 3 months for as long as you are taking the medicine. Follow these instructions at home: Alcohol use Do not drink alcohol if your health care provider tells you not to drink. If you drink alcohol: Limit how much you have to 0-2 drinks a day. Know how much alcohol is in your drink. In the [place] one drink equals one 12 oz bottle of beer (355 mL), one 5 oz glass of wine (148 mL), or one 1 oz glass of hard liquor (44 mL). Lifestyle Do not use any products that contain nicotine or tobacco. These products include cigarettes, chewing tobacco, and vaping devices, such as e-cigarettes. If you need help quitting, ask your health care provider. Do not use street drugs. Do not share needles. Ask your health care provider for help if you need support or information about quitting drugs. General instructions Schedule regular health, dental, and eye exams. Stay current with your vaccines. Tell your health care provider if: You often feel depressed. You have ever been abused or do not feel safe at home. Summary Adopting a healthy lifestyle and getting preventive care are important in promoting health and wellness. Follow your health care provider's instructions about healthy diet, exercising, and getting tested or screened for diseases. Follow your health care provider's instructions on monitoring your cholesterol and blood pressure. This information is not intended to replace advice given to you by your health care provider. Make sure you discuss any questions you have with your health care provider. Document Revised: 05/16/2020 Document Reviewed: 05/16/2020 Elsevier Patient Education  2023 [employer]  

## 2022-01-16 ENCOUNTER — Ambulatory Visit (INDEPENDENT_AMBULATORY_CARE_PROVIDER_SITE_OTHER): Payer: Medicare HMO | Admitting: Family Medicine

## 2022-01-16 ENCOUNTER — Encounter: Payer: Self-pay | Admitting: Family Medicine

## 2022-01-16 VITALS — BP 125/78 | HR 69 | Temp 97.5°F | Ht 68.0 in | Wt 193.4 lb

## 2022-01-16 DIAGNOSIS — M7918 Myalgia, other site: Secondary | ICD-10-CM | POA: Diagnosis not present

## 2022-01-16 DIAGNOSIS — Z0000 Encounter for general adult medical examination without abnormal findings: Secondary | ICD-10-CM

## 2022-01-16 DIAGNOSIS — G8929 Other chronic pain: Secondary | ICD-10-CM

## 2022-01-16 DIAGNOSIS — Z23 Encounter for immunization: Secondary | ICD-10-CM

## 2022-01-16 DIAGNOSIS — M533 Sacrococcygeal disorders, not elsewhere classified: Secondary | ICD-10-CM

## 2022-01-16 LAB — LIPID PANEL
Cholesterol: 101 mg/dL (ref 0–200)
HDL: 50.7 mg/dL (ref >39.00)
LDL Cholesterol: 40 mg/dL (ref 0–99)
NonHDL: 49.91
Total CHOL/HDL Ratio: 2
Triglycerides: 52 mg/dL (ref 0.0–149.0)
VLDL: 10.4 mg/dL (ref 0.0–40.0)

## 2022-01-16 LAB — CBC
HCT: 43.7 % (ref 39.0–52.0)
Hemoglobin: 14.7 g/dL (ref 13.0–17.0)
MCHC: 33.6 g/dL (ref 30.0–36.0)
MCV: 98.6 fl (ref 78.0–100.0)
Platelets: 315 10*3/uL (ref 150.0–400.0)
RBC: 4.43 Mil/uL (ref 4.22–5.81)
RDW: 14.1 % (ref 11.5–15.5)
WBC: 6.5 10*3/uL (ref 4.0–10.5)

## 2022-01-16 LAB — COMPREHENSIVE METABOLIC PANEL
ALT: 20 U/L (ref 0–53)
AST: 28 U/L (ref 0–37)
Albumin: 3.9 g/dL (ref 3.5–5.2)
Alkaline Phosphatase: 28 U/L — ABNORMAL LOW (ref 39–117)
BUN: 30 mg/dL — ABNORMAL HIGH (ref 6–23)
CO2: 26 mEq/L (ref 19–32)
Calcium: 9.1 mg/dL (ref 8.4–10.5)
Chloride: 107 mEq/L (ref 96–112)
Creatinine, Ser: 1.03 mg/dL (ref 0.40–1.50)
GFR: 68.08 mL/min (ref >60.00)
Glucose, Bld: 96 mg/dL (ref 70–99)
Potassium: 4.5 mEq/L (ref 3.5–5.1)
Sodium: 142 mEq/L (ref 135–145)
Total Bilirubin: 1.2 mg/dL (ref 0.2–1.2)
Total Protein: 6.6 g/dL (ref 6.0–8.3)

## 2022-01-16 MED ORDER — ZOSTER VAC RECOMB ADJUVANTED 50 MCG/0.5ML IM SUSR: 0.5000 mL | Freq: Once | INTRAMUSCULAR | 0 refills | Status: AC

## 2022-01-16 NOTE — Progress Notes (Signed)
Office Note 01/16/2022  CC:  Chief Complaint  Patient presents with   Annual Exam    Pt is fasting   HPI:  Patient is a 82 y.o. male who is here for annual health maintenance exam. Paul Knight feels good.  He has some mild, waxing and waning pain in the left SI and gluteal region the last several months. He has a history of right ankle weakness due to peroneal tendon rupture, wears AFO. He does home self PT.  This has altered his gait some over the last year.  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 Tarsal tunnel syndrome.  Ortho and neuro could not determine dx.     Osteoarthritis, multiple sites    shoulder esp   Peroneal tendon rupture    right   Squamous cell carcinoma of skin 06/11/2016   right cheek - CX3 + 5FU    Past Surgical History:  Procedure Laterality Date   APPENDECTOMY  1970s   COLONOSCOPY  01/08/2009   Normal; recall 10 yrs   REVERSE SHOULDER ARTHROPLASTY Right 03/19/2018   Procedure: REVERSE SHOULDER ARTHROPLASTY;  Surgeon: Hiram Gash, MD;  Location: [facility];  Service: Orthopedics;  Laterality: Right;  with exparel   TONSILLECTOMY  as a child    Family History  Problem Relation Age of Onset   Heart failure Mother    Heart attack Mother        Deceased, 93   Other Father        Deceased, 49   Heart disease Brother    Healthy Son    Thyroid disease Daughter    Cancer Daughter        ovarian    Social History   Socioeconomic History   Marital status: Married    Spouse name: Not on file   Number of children: 4   Years of education: Not on file   Highest education level: Not on file  Occupational History   Occupation: Retired  Tobacco Use   Smoking status: Former    Packs/day: 0.25    Years: 10.00    Total pack years: 2.50    Types: Cigarettes   Smokeless tobacco:  Never   Tobacco comments:    qiut  when 82 years old  Vaping Use   Vaping Use: Never used  Substance and Sexual Activity   Alcohol use: Yes    Alcohol/week: 1.0 - 2.0 standard drink of alcohol    Types: 1 - 2 Glasses of wine per week    Comment: 2 glasses of wine nightly x 5 years   Drug use: No   Sexual activity: Not Currently  Other Topics Concern   Not on file  Social History Narrative   Married, 4 children in the [place] area.  Grandchildren x 7.   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   No tobacco.  Alcohol 2 glasses wine/day.   Exercise: [employer] fitness, walking in park, stationary bike at home: 3 days per week for 1 hour.   Social Determinants of Health   Financial Resource Strain: Low Risk  (07/12/2021)   Overall Financial Resource Strain (CARDIA)    Difficulty of Paying Living Expenses: Not hard at all  Food Insecurity: No Food Insecurity (07/12/2021)   Hunger Vital Sign  Worried About Charity fundraiser in the Last Year: Never true    [place] in the Last Year: Never true  Transportation Needs: No Transportation Needs (07/12/2021)   PRAPARE - Hydrologist (Medical): No    Lack of Transportation (Non-Medical): No  Physical Activity: Insufficiently Active (07/12/2021)   Exercise Vital Sign    Days of Exercise per Week: 3 days    Minutes of Exercise per Session: 30 min  Stress: No Stress Concern Present (07/12/2021)   [place]    Feeling of Stress : Not at all  Social Connections: Moderately Integrated (07/12/2021)   Social Connection and Isolation Panel [NHANES]    Frequency of Communication with Friends and Family: More than three times a week    Frequency of Social Gatherings with Friends and Family: More than three times a week    Attends Religious Services: More than 4 times per year    Active Member of [employer] or Organizations: No    Attends Archivist  Meetings: Never    Marital Status: Married  Human resources officer Violence: Not At Risk (07/12/2021)   Humiliation, Afraid, Rape, and Kick questionnaire    Fear of Current or Ex-Partner: No    Emotionally Abused: No    Physically Abused: No    Sexually Abused: No    Outpatient Medications Prior to Visit  Medication Sig Dispense Refill   naproxen (NAPROSYN) 250 MG tablet Take by mouth 2 (two) times daily with a meal. (Patient not taking: Reported on 01/13/2021)     tadalafil (CIALIS) 10 MG tablet 1-2 tabs po qd prn intercourse.  Take 30 min prior to intercourse. (Patient not taking: Reported on 01/14/2020) 10 tablet 0   No facility-administered medications prior to visit.    No Known Allergies  Review of Systems  Constitutional:  Negative for appetite change, chills, fatigue and fever.  HENT:  Negative for congestion, dental problem, ear pain and sore throat.   Eyes:  Negative for discharge, redness and visual disturbance.  Respiratory:  Negative for cough, chest tightness, shortness of breath and wheezing.   Cardiovascular:  Negative for chest pain, palpitations and leg swelling.  Gastrointestinal:  Negative for abdominal pain, blood in stool, diarrhea, nausea and vomiting.  Genitourinary:  Negative for difficulty urinating, dysuria, flank pain, frequency, hematuria and urgency.  Musculoskeletal:  Positive for myalgias (left glut as per hpi). Negative for arthralgias, back pain, joint swelling and neck stiffness.  Skin:  Negative for pallor and rash.  Neurological:  Negative for dizziness, speech difficulty, weakness and headaches.  Hematological:  Negative for adenopathy. Does not bruise/bleed easily.  Psychiatric/Behavioral:  Negative for confusion and sleep disturbance. The patient is not nervous/anxious.     PE;    01/16/2022    8:05 AM 01/13/2021    9:01 AM 06/08/2020   11:08 AM  Vitals with BMI  Height '5\' 8"'$  '5\' 9"'$  5' 7.5"  Weight 193 lbs 6 oz 198 lbs 194 lbs  BMI 29.41 41.96 22.29   Systolic 798 921 194  Diastolic 78 77 76  Pulse 69 72 68    Gen: Alert, well appearing.  Patient is oriented to person, place, time, and situation. AFFECT: pleasant, lucid thought and speech. ENT: Ears: EACs clear, normal epithelium.  TMs with good light reflex and landmarks bilaterally.  Eyes: no injection, icteris, swelling, or exudate.  EOMI, PERRLA. Nose: no drainage or turbinate edema/swelling.  No injection or focal lesion.  Mouth: lips without lesion/swelling.  Oral mucosa pink and moist.  Dentition intact and without obvious caries or gingival swelling.  Oropharynx without erythema, exudate, or swelling.  Neck: supple/nontender.  No LAD, mass, or TM.  Carotid pulses 2+ bilaterally, without bruits. CV: RRR, no m/r/g.   LUNGS: CTA bilat, nonlabored resps, good aeration in all lung fields. ABD: soft, NT, ND, BS normal.  No hepatospenomegaly or mass.  No bruits. EXT: no clubbing, cyanosis, or edema.  Musculoskeletal: no joint swelling, erythema, warmth, or tenderness.  ROM of all joints intact. He has some mild tenderness over the left SI joint and extending down over the left gluteal region to the level of the sciatic notch.  Pelvic distraction testing and sacral compression do not elicit any symptoms. Skin - no sores or suspicious lesions or rashes or color changes  Pertinent labs:  Lab Results  Component Value Date   TSH 1.20 05/20/2015   Lab Results  Component Value Date   WBC 7.4 01/13/2021   HGB 14.4 01/13/2021   HCT 44.1 01/13/2021   MCV 98.5 01/13/2021   PLT 310.0 01/13/2021   Lab Results  Component Value Date   CREATININE 0.94 01/13/2021   BUN 20 01/13/2021   NA 137 01/13/2021   K 4.4 01/13/2021   CL 105 01/13/2021   CO2 22 01/13/2021   Lab Results  Component Value Date   ALT 23 01/13/2021   AST 30 01/13/2021   ALKPHOS 28 (L) 01/13/2021   BILITOT 1.4 (H) 01/13/2021   Lab Results  Component Value Date   CHOL 118 01/13/2021   Lab Results  Component  Value Date   HDL 54.70 01/13/2021   Lab Results  Component Value Date   LDLCALC 51 01/13/2021   Lab Results  Component Value Date   TRIG 63.0 01/13/2021   Lab Results  Component Value Date   CHOLHDL 2 01/13/2021   Lab Results  Component Value Date   PSA 0.44 02/27/2013   PSA 0.43 08/09/2010   PSA 0.39 08/05/2009   Lab Results  Component Value Date   VITAMINB12 285 01/14/2020   ASSESSMENT AND PLAN:   #1 health maintenance exam: Reviewed age and gender appropriate health maintenance issues (prudent diet, regular exercise, health risks of tobacco and excessive alcohol, use of seatbelts, fire alarms in home, use of sunscreen).  Also reviewed age and gender appropriate health screening as well as vaccine recommendations. Vaccines: shingrix->rx to pharmacy today.  Otherwise all utd. Labs:  cbc, lipids, cmet Prostate ca screening: no hx of abnormal PSAs.  No further prostate ca screening indicated due to age. Colon ca screening: normal colonoscopy 2011.  Pt has declined any further colon ca screening.  #2 left SI joint and gluteal pain. Secondary to altered gait from chronic right ankle problem. He will continue to take an occasional dose of Aleve.  An After Visit Summary was printed and given to the patient.  FOLLOW UP:  Return in about 1 year (around 01/17/2023) for annual CPE (fasting).  Signed:  Crissie Sickles, MD           01/16/2022

## 2022-04-18 DIAGNOSIS — H16223 Keratoconjunctivitis sicca, not specified as Sjogren's, bilateral: Secondary | ICD-10-CM | POA: Diagnosis not present

## 2022-04-18 DIAGNOSIS — H26492 Other secondary cataract, left eye: Secondary | ICD-10-CM | POA: Diagnosis not present

## 2022-04-18 DIAGNOSIS — H47091 Other disorders of optic nerve, not elsewhere classified, right eye: Secondary | ICD-10-CM | POA: Diagnosis not present

## 2022-07-05 DIAGNOSIS — H18413 Arcus senilis, bilateral: Secondary | ICD-10-CM | POA: Diagnosis not present

## 2022-07-05 DIAGNOSIS — H02831 Dermatochalasis of right upper eyelid: Secondary | ICD-10-CM | POA: Diagnosis not present

## 2022-07-05 DIAGNOSIS — H26492 Other secondary cataract, left eye: Secondary | ICD-10-CM | POA: Diagnosis not present

## 2022-07-05 DIAGNOSIS — Z961 Presence of intraocular lens: Secondary | ICD-10-CM | POA: Diagnosis not present

## 2022-07-11 DIAGNOSIS — H26492 Other secondary cataract, left eye: Secondary | ICD-10-CM | POA: Diagnosis not present

## 2022-08-15 ENCOUNTER — Ambulatory Visit (INDEPENDENT_AMBULATORY_CARE_PROVIDER_SITE_OTHER): Payer: Medicare HMO

## 2022-08-15 VITALS — Wt 193.0 lb

## 2022-08-15 DIAGNOSIS — Z0000 Encounter for general adult medical examination without abnormal findings: Secondary | ICD-10-CM | POA: Diagnosis not present

## 2022-08-15 NOTE — Progress Notes (Signed)
Subjective:   Paul Knight is a 82 y.o. male who presents for Medicare Annual/Subsequent preventive examination.  Visit Complete: Virtual  I connected with  Eiljah Heidenreich Mash[place] on 08/15/22 by a audio enabled telemedicine application and verified that I am speaking with the correct person using two identifiers.  Patient Location: Home  Provider Location: Home Office  I discussed the limitations of evaluation and management by telemedicine. The patient expressed understanding and agreed to proceed.   Vital Signs: Unable to obtain new vitals due to this being a telehealth visit.    Review of Systems     Cardiac Risk Factors include: advanced age (>73men, >11 women);male gender     Objective:    Today's Vitals   08/15/22 0928  Weight: 193 lb (87.5 kg)   Body mass index is 29.35 kg/m.     08/15/2022    9:32 AM 07/12/2021    9:26 AM 06/08/2020   11:21 AM 07/12/2019    7:38 PM 03/19/2018   12:20 PM 03/19/2018    6:40 AM 03/12/2018   11:05 AM  Advanced Directives  Does Patient Have a Medical Advance Directive? Yes Yes Yes No Yes Yes Yes  Type of Estate agent of [place];Living will Healthcare Power of [employer] Power of [employer] Power of [place];Living will Healthcare Power of [place];Living will Healthcare Power of [place];Living will  Does patient want to make changes to medical advance directive?     No - Patient declined  No - Patient declined  Copy of Healthcare Power of Attorney in Chart? No - copy requested No - copy requested Yes - validated most recent copy scanned in chart (See row information)  No - copy requested No - copy requested No - copy requested  Would patient like information on creating a medical advance directive?    No - Patient declined       Current Medications (verified) Outpatient Encounter Medications as of 08/15/2022  Medication Sig   Cyanocobalamin (VITAMIN B 12 PO) Take by mouth.   METAMUCIL FIBER PO Take by  mouth.   [DISCONTINUED] naproxen (NAPROSYN) 250 MG tablet Take by mouth 2 (two) times daily with a meal. (Patient not taking: Reported on 01/13/2021)   [DISCONTINUED] tadalafil (CIALIS) 10 MG tablet 1-2 tabs po qd prn intercourse.  Take 30 min prior to intercourse. (Patient not taking: Reported on 01/14/2020)   No facility-administered encounter medications on file as of 08/15/2022.    Allergies (verified) Patient has no known allergies.   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 Tarsal tunnel syndrome.  Ortho and neuro could not determine dx.     Osteoarthritis, multiple sites    shoulder esp   Peroneal tendon rupture    right   Squamous cell carcinoma of skin 06/11/2016   right cheek - CX3 + 5FU   Past Surgical History:  Procedure Laterality Date   APPENDECTOMY  1970s   COLONOSCOPY  01/08/2009   Normal; recall 10 yrs   REVERSE SHOULDER ARTHROPLASTY Right 03/19/2018   Procedure: REVERSE SHOULDER ARTHROPLASTY;  Surgeon: Bjorn Pippin, MD;  Location: [facility];  Service: Orthopedics;  Laterality: Right;  with exparel   TONSILLECTOMY  as a child   Family History  Problem Relation Age of Onset  Heart failure Mother    Heart attack Mother        Deceased, 8   Other Father        Deceased, 64   Heart disease Brother    Healthy Son    Thyroid disease Daughter    Cancer Daughter        ovarian   Social History   Socioeconomic History   Marital status: Married    Spouse name: Not on file   Number of children: 4   Years of education: Not on file   Highest education level: Not on file  Occupational History   Occupation: Retired  Tobacco Use   Smoking status: Former    Current packs/day: 0.25    Average packs/day: 0.3 packs/day for 10.0 years (2.5 ttl pk-yrs)    Types: Cigarettes   Smokeless  tobacco: Never   Tobacco comments:    qiut  when 82 years old  Vaping Use   Vaping status: Never Used  Substance and Sexual Activity   Alcohol use: Yes    Alcohol/week: 1.0 - 2.0 standard drink of alcohol    Types: 1 - 2 Glasses of wine per week    Comment: 2 glasses of wine nightly x 5 years   Drug use: No   Sexual activity: Not Currently  Other Topics Concern   Not on file  Social History Narrative   Married, 4 children in the [place] area.  Grandchildren x 7.   Occupation: Curator for [employer], retired.   No tobacco.  Alcohol 2 glasses wine/day.   Exercise: [employer] fitness, walking in park, stationary bike at home: 3 days per week for 1 hour.   Social Determinants of Health   Financial Resource Strain: Low Risk  (08/15/2022)   Overall Financial Resource Strain (CARDIA)    Difficulty of Paying Living Expenses: Not hard at all  Food Insecurity: No Food Insecurity (08/15/2022)   Hunger Vital Sign    Worried About Running Out of Food in the Last Year: Never true    Ran Out of Food in the Last Year: Never true  Transportation Needs: No Transportation Needs (08/15/2022)   PRAPARE - Administrator, Civil Service (Medical): No    Lack of Transportation (Non-Medical): No  Physical Activity: Sufficiently Active (08/15/2022)   Exercise Vital Sign    Days of Exercise per Week: 5 days    Minutes of Exercise per Session: 30 min  Stress: No Stress Concern Present (08/15/2022)   [employer] of Occupational Health - Occupational Stress Questionnaire    Feeling of Stress : Not at all  Social Connections: Moderately Integrated (08/15/2022)   Social Connection and Isolation Panel [NHANES]    Frequency of Communication with Friends and Family: More than three times a week    Frequency of Social Gatherings with Friends and Family: More than three times a week    Attends Religious Services: More than 4 times per year    Active Member of [employer] or Organizations: No    Attends Museum/gallery exhibitions officer: Never    Marital Status: Married    Tobacco Counseling Counseling given: Not Answered Tobacco comments: qiut  when 82 years old   Clinical Intake:  Pre-visit preparation completed: Yes  Pain : No/denies pain     BMI - recorded: 29.35 Nutritional Status: BMI > 30  Obese Nutritional Risks: None Diabetes: No  How often do you need to have someone help you when you read instructions,  pamphlets, or other written materials from your doctor or pharmacy?: 1 - Never  Interpreter Needed?: No  Information entered by :: Lanier Ensign, LPN   Activities of Daily Living    08/15/2022    9:29 AM  In your present state of health, do you have any difficulty performing the following activities:  Hearing? 0  Vision? 0  Difficulty concentrating or making decisions? 0  Walking or climbing stairs? 0  Dressing or bathing? 0  Doing errands, shopping? 0  Preparing Food and eating ? N  Using the Toilet? N  In the past six months, have you accidently leaked urine? N  Do you have problems with loss of bowel control? N  Managing your Medications? N  Managing your Finances? N  Housekeeping or managing your Housekeeping? N    Patient Care Team: Jeoffrey Massed, MD as PCP - General (Family Medicine) Glendale Chard, DO as Consulting Physician (Neurology) Pati Gallo, MD as Consulting Physician (Sports Medicine) Janalyn Harder, MD (Inactive) as Consulting Physician (Dermatology) Salvatore Marvel, MD as Consulting Physician (Orthopedic Surgery) Bjorn Pippin, MD as Consulting Physician (Orthopedic Surgery)  Indicate any recent Medical Services you may have received from other than [facility] providers in the past year (date may be approximate).     Assessment:   This is a routine wellness examination for [place].  Hearing/Vision screen Hearing Screening - Comments:: Pt denies any hearing issues  Vision Screening - Comments:: Pt follows up with Dr Cherlynn Polo for annual eye  exams   Dietary issues and exercise activities discussed:     Goals Addressed             This Visit's Progress    Patient Stated       Stay healthy and continue to exercise        Depression Screen    08/15/2022    9:32 AM 01/16/2022    8:07 AM 07/12/2021    9:25 AM 01/13/2021    9:01 AM 06/08/2020   11:24 AM 01/14/2020    9:21 AM 05/28/2017   11:04 AM  PHQ 2/9 Scores  PHQ - 2 Score 0 0 0 0 0 0 0  PHQ- 9 Score       1    Fall Risk    08/15/2022    9:35 AM 07/12/2021    9:27 AM 01/13/2021    9:01 AM 06/08/2020   11:23 AM 01/14/2020    9:21 AM  Fall Risk   Falls in the past year? 1 0 0 1 1  Number falls in past yr: 1 0 0 0 1  Injury with Fall? 0 0 0 0 0  Risk for fall due to : Impaired vision;Impaired balance/gait Impaired vision;Impaired balance/gait;Impaired mobility     Risk for fall due to: Comment  after tendon rupture     Follow up Falls prevention discussed Falls prevention discussed Falls evaluation completed Falls prevention discussed Falls evaluation completed    MEDICARE RISK AT HOME:  Medicare Risk at Home - 08/15/22 0935     Any stairs in or around the home? Yes    If so, are there any without handrails? No    Home free of loose throw rugs in walkways, pet beds, electrical cords, etc? Yes    Adequate lighting in your home to reduce risk of falls? Yes    Life alert? No    Use of a cane, walker or w/c? No    Grab bars in the bathroom?  Yes    Shower chair or bench in shower? Yes    Elevated toilet seat or a handicapped toilet? Yes             TIMED UP AND GO:  Was the test performed?  No    Cognitive Function:    05/28/2017    8:30 AM  MMSE - Mini Mental State Exam  Orientation to time 5  Orientation to Place 5  Registration 3  Attention/ Calculation 5  Recall 2  Language- name 2 objects 2  Language- repeat 1  Language- follow 3 step command 3  Language- read & follow direction 1  Write a sentence 1  Copy design 1  Total score 29         08/15/2022    9:36 AM 06/08/2020   11:29 AM  6CIT Screen  What Year? 0 points 0 points  What month? 0 points 0 points  What time? 0 points 0 points  Count back from 20 0 points 0 points  Months in reverse 0 points 2 points  Repeat phrase 0 points 2 points  Total Score 0 points 4 points    Immunizations Immunization History  Administered Date(s) Administered   COVID-19, mRNA, vaccine(Comirnaty)12 years and older 11/02/2021   Fluad Quad(high Dose 65+) 09/26/2018, 10/07/2019, 10/18/2020, 09/15/2021   Influenza, High Dose Seasonal PF 09/16/2015, 10/04/2016, 10/21/2017   Influenza,inj,Quad PF,6+ Mos 10/12/2013, 10/06/2014   PFIZER(Purple Top)SARS-COV-2 Vaccination 01/23/2019, 02/13/2019, 11/16/2019, 05/06/2020   Pfizer Covid-19 Vaccine Bivalent Booster 58yrs & up 10/28/2020   Pneumococcal Conjugate-13 05/20/2014   Pneumococcal Polysaccharide-23 09/17/2006   Respiratory Syncytial Virus Vaccine,Recomb Aduvanted(Arexvy) 07/30/2022   Td 08/09/2002   Tdap 02/09/2008, 05/14/2013   Zoster Recombinant(Shingrix) 07/30/2022   Zoster, Live 08/09/2010    TDAP status: Up to date  Flu Vaccine status: Due, Education has been provided regarding the importance of this vaccine. Advised may receive this vaccine at local pharmacy or Health Dept. Aware to provide a copy of the vaccination record if obtained from local pharmacy or Health Dept. Verbalized acceptance and understanding.  Pneumococcal vaccine status: Up to date  Covid-19 vaccine status: Completed vaccines  Qualifies for Shingles Vaccine? Yes   Zostavax completed Yes   Shingrix Completed?: No.    Education has been provided regarding the importance of this vaccine. Patient has been advised to call insurance company to determine out of pocket expense if they have not yet received this vaccine. Advised may also receive vaccine at local pharmacy or Health Dept. Verbalized acceptance and understanding.  Screening Tests Health Maintenance  Topic  Date Due   COVID-19 Vaccine (7 - 2023-24 season) 03/05/2022   INFLUENZA VACCINE  08/09/2022   Zoster Vaccines- Shingrix (2 of 2) 09/24/2022   DTaP/Tdap/Td (4 - Td or Tdap) 05/15/2023   Medicare Annual Wellness (AWV)  08/15/2023   Pneumonia Vaccine 31+ Years old  Completed   HPV VACCINES  Aged Out    Health Maintenance  Health Maintenance Due  Topic Date Due   COVID-19 Vaccine (7 - 2023-24 season) 03/05/2022   INFLUENZA VACCINE  08/09/2022    Colorectal cancer screening: No longer required.    Additional Screening:   Vision Screening: Recommended annual ophthalmology exams for early detection of glaucoma and other disorders of the eye. Is the patient up to date with their annual eye exam?  Yes  Who is the provider or what is the name of the office in which the patient attends annual eye exams? Dr Cherlynn Polo  If  pt is not established with a provider, would they like to be referred to a provider to establish care? No .   Dental Screening: Recommended annual dental exams for proper oral hygiene    Community Resource Referral / Chronic Care Management: CRR required this visit?  No   CCM required this visit?  No     Plan:     I have personally reviewed and noted the following in the patient's chart:   Medical and social history Use of alcohol, tobacco or illicit drugs  Current medications and supplements including opioid prescriptions. Patient is not currently taking opioid prescriptions. Functional ability and status Nutritional status Physical activity Advanced directives List of other physicians Hospitalizations, surgeries, and ER visits in previous 12 months Vitals Screenings to include cognitive, depression, and falls Referrals and appointments  In addition, I have reviewed and discussed with patient certain preventive protocols, quality metrics, and best practice recommendations. A written personalized care plan for preventive services as well as general  preventive health recommendations were provided to patient.     Marzella Schlein, LPN   01/13/1094   After Visit Summary: (MyChart) Due to this being a telephonic visit, the after visit summary with patients personalized plan was offered to patient via MyChart   Nurse Notes: none

## 2022-08-15 NOTE — Patient Instructions (Signed)
[name] , Thank you for taking time to come for your Medicare Wellness Visit. I appreciate your ongoing commitment to your health goals. Please review the following plan we discussed and let me know if I can assist you in the future.   Referrals/Orders/Follow-Ups/Clinician Recommendations: stay healthy and continue to exercise   This is a list of the screening recommended for you and due dates:  Health Maintenance  Topic Date Due   COVID-19 Vaccine (7 - 2023-24 season) 03/05/2022   Flu Shot  08/09/2022   Zoster (Shingles) Vaccine (2 of 2) 09/24/2022   DTaP/Tdap/Td vaccine (4 - Td or Tdap) 05/15/2023   Medicare Annual Wellness Visit  08/15/2023   Pneumonia Vaccine  Completed   HPV Vaccine  Aged Out    Advanced directives: (Copy Requested) Please bring a copy of your health care power of attorney and living will to the office to be added to your chart at your convenience.  Next Medicare Annual Wellness Visit scheduled for next year: Yes  Preventive Care 82 Years and Older, Male  Preventive care refers to lifestyle choices and visits with your health care provider that can promote health and wellness. What does preventive care include? A yearly physical exam. This is also called an annual well check. Dental exams once or twice a year. Routine eye exams. Ask your health care provider how often you should have your eyes checked. Personal lifestyle choices, including: Daily care of your teeth and gums. Regular physical activity. Eating a healthy diet. Avoiding tobacco and drug use. Limiting alcohol use. Practicing safe sex. Taking low doses of aspirin every day. Taking vitamin and mineral supplements as recommended by your health care provider. What happens during an annual well check? The services and screenings done by your health care provider during your annual well check will depend on your age, overall health, lifestyle risk factors, and family history of disease. Counseling   Your health care provider may ask you questions about your: Alcohol use. Tobacco use. Drug use. Emotional well-being. Home and relationship well-being. Sexual activity. Eating habits. History of falls. Memory and ability to understand (cognition). Work and work Astronomer. Screening  You may have the following tests or measurements: Height, weight, and BMI. Blood pressure. Lipid and cholesterol levels. These may be checked every 5 years, or more frequently if you are over 75 years old. Skin check. Lung cancer screening. You may have this screening every year starting at age 2 if you have a 30-pack-year history of smoking and currently smoke or have quit within the past 15 years. Fecal occult blood test (FOBT) of the stool. You may have this test every year starting at age 29. Flexible sigmoidoscopy or colonoscopy. You may have a sigmoidoscopy every 5 years or a colonoscopy every 10 years starting at age 39. Prostate cancer screening. Recommendations will vary depending on your family history and other risks. Hepatitis C blood test. Hepatitis B blood test. Sexually transmitted disease (STD) testing. Diabetes screening. This is done by checking your blood sugar (glucose) after you have not eaten for a while (fasting). You may have this done every 1-3 years. Abdominal aortic aneurysm (AAA) screening. You may need this if you are a current or former smoker. Osteoporosis. You may be screened starting at age 59 if you are at high risk. Talk with your health care provider about your test results, treatment options, and if necessary, the need for more tests. Vaccines  Your health care provider may recommend certain vaccines, such  as: Influenza vaccine. This is recommended every year. Tetanus, diphtheria, and acellular pertussis (Tdap, Td) vaccine. You may need a Td booster every 10 years. Zoster vaccine. You may need this after age 67. Pneumococcal 13-valent conjugate (PCV13) vaccine.  One dose is recommended after age 65. Pneumococcal polysaccharide (PPSV23) vaccine. One dose is recommended after age 80. Talk to your health care provider about which screenings and vaccines you need and how often you need them. This information is not intended to replace advice given to you by your health care provider. Make sure you discuss any questions you have with your health care provider. Document Released: 01/21/2015 Document Revised: 09/14/2015 Document Reviewed: 10/26/2014 Elsevier Interactive Patient Education  2017 [employer].  Fall Prevention in the Home Falls can cause injuries. They can happen to people of all ages. There are many things you can do to make your home safe and to help prevent falls. What can I do on the outside of my home? Regularly fix the edges of walkways and driveways and fix any cracks. Remove anything that might make you trip as you walk through a door, such as a raised step or threshold. Trim any bushes or trees on the path to your home. Use bright outdoor lighting. Clear any walking paths of anything that might make someone trip, such as rocks or tools. Regularly check to see if handrails are loose or broken. Make sure that both sides of any steps have handrails. Any raised decks and porches should have guardrails on the edges. Have any leaves, snow, or ice cleared regularly. Use sand or salt on walking paths during winter. Clean up any spills in your garage right away. This includes oil or grease spills. What can I do in the bathroom? Use night lights. Install grab bars by the toilet and in the tub and shower. Do not use towel bars as grab bars. Use non-skid mats or decals in the tub or shower. If you need to sit down in the shower, use a plastic, non-slip stool. Keep the floor dry. Clean up any water that spills on the floor as soon as it happens. Remove soap buildup in the tub or shower regularly. Attach bath mats securely with double-sided  non-slip rug tape. Do not have throw rugs and other things on the floor that can make you trip. What can I do in the bedroom? Use night lights. Make sure that you have a light by your bed that is easy to reach. Do not use any sheets or blankets that are too big for your bed. They should not hang down onto the floor. Have a firm chair that has side arms. You can use this for support while you get dressed. Do not have throw rugs and other things on the floor that can make you trip. What can I do in the kitchen? Clean up any spills right away. Avoid walking on wet floors. Keep items that you use a lot in easy-to-reach places. If you need to reach something above you, use a strong step stool that has a grab bar. Keep electrical cords out of the way. Do not use floor polish or wax that makes floors slippery. If you must use wax, use non-skid floor wax. Do not have throw rugs and other things on the floor that can make you trip. What can I do with my stairs? Do not leave any items on the stairs. Make sure that there are handrails on both sides of the stairs and use  them. Fix handrails that are broken or loose. Make sure that handrails are as long as the stairways. Check any carpeting to make sure that it is firmly attached to the stairs. Fix any carpet that is loose or worn. Avoid having throw rugs at the top or bottom of the stairs. If you do have throw rugs, attach them to the floor with carpet tape. Make sure that you have a light switch at the top of the stairs and the bottom of the stairs. If you do not have them, ask someone to add them for you. What else can I do to help prevent falls? Wear shoes that: Do not have high heels. Have rubber bottoms. Are comfortable and fit you well. Are closed at the toe. Do not wear sandals. If you use a stepladder: Make sure that it is fully opened. Do not climb a closed stepladder. Make sure that both sides of the stepladder are locked into place. Ask  someone to hold it for you, if possible. Clearly mark and make sure that you can see: Any grab bars or handrails. First and last steps. Where the edge of each step is. Use tools that help you move around (mobility aids) if they are needed. These include: Canes. Walkers. Scooters. Crutches. Turn on the lights when you go into a dark area. Replace any light bulbs as soon as they burn out. Set up your furniture so you have a clear path. Avoid moving your furniture around. If any of your floors are uneven, fix them. If there are any pets around you, be aware of where they are. Review your medicines with your doctor. Some medicines can make you feel dizzy. This can increase your chance of falling. Ask your doctor what other things that you can do to help prevent falls. This information is not intended to replace advice given to you by your health care provider. Make sure you discuss any questions you have with your health care provider. Document Released: 10/21/2008 Document Revised: 06/02/2015 Document Reviewed: 01/29/2014 Elsevier Interactive Patient Education  2017 [employer].

## 2022-10-17 ENCOUNTER — Ambulatory Visit (INDEPENDENT_AMBULATORY_CARE_PROVIDER_SITE_OTHER): Payer: Medicare HMO

## 2022-10-17 DIAGNOSIS — Z23 Encounter for immunization: Secondary | ICD-10-CM | POA: Diagnosis not present

## 2022-10-18 NOTE — Progress Notes (Signed)
Pt presented for flu vaccine, given IM left deltoid with no issues.

## 2023-01-21 ENCOUNTER — Encounter: Payer: Medicare HMO | Admitting: Family Medicine

## 2023-01-21 NOTE — Progress Notes (Deleted)
 Office Note 01/21/2023  CC: No chief complaint on file.   HPI:  Patient is a 83 y.o. male who is here for annual health maintenance exam.  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 Tarsal tunnel syndrome.  Ortho and neuro could not determine dx.     Osteoarthritis, multiple sites    shoulder esp   Peroneal tendon rupture    right   Squamous cell carcinoma of skin 06/11/2016   right cheek - CX3 + 5FU    Past Surgical History:  Procedure Laterality Date   APPENDECTOMY  1970s   COLONOSCOPY  01/08/2009   Normal; recall 10 yrs   REVERSE SHOULDER ARTHROPLASTY Right 03/19/2018   Procedure: REVERSE SHOULDER ARTHROPLASTY;  Surgeon: Cristy Bonner DASEN, MD;  Location: [facility];  Service: Orthopedics;  Laterality: Right;  with exparel    TONSILLECTOMY  as a child    Family History  Problem Relation Age of Onset   Heart failure Mother    Heart attack Mother        Deceased, 78   Other Father        Deceased, 81   Heart disease Brother    Healthy Son    Thyroid  disease Daughter    Cancer Daughter        ovarian    Social History   Socioeconomic History   Marital status: Married    Spouse name: Not on file   Number of children: 4   Years of education: Not on file   Highest education level: Not on file  Occupational History   Occupation: Retired  Tobacco Use   Smoking status: Former    Current packs/day: 0.25    Average packs/day: 0.3 packs/day for 10.0 years (2.5 ttl pk-yrs)    Types: Cigarettes   Smokeless tobacco: Never   Tobacco comments:    qiut  when 83 years old  Vaping Use   Vaping status: Never Used  Substance and Sexual Activity   Alcohol use: Yes    Alcohol/week: 1.0 - 2.0 standard drink of alcohol    Types: 1 - 2 Glasses of wine per week    Comment: 2 glasses of wine nightly x 5 years    Drug use: No   Sexual activity: Not Currently  Other Topics Concern   Not on file  Social History Narrative   Married, 4 children in the [place] area.  Grandchildren x 7.   Occupation: curator for [employer], retired.   No tobacco.  Alcohol 2 glasses wine/day.   Exercise: [employer], walking in park, stationary bike at home: 3 days per week for 1 hour.   Social Drivers of Corporate Investment Banker Strain: Low Risk  (08/15/2022)   Overall Financial Resource Strain (CARDIA)    Difficulty of Paying Living Expenses: Not hard at all  Food Insecurity: No Food Insecurity (08/15/2022)   Hunger Vital Sign    Worried About Running Out of Food in the Last Year: Never true    Ran Out of Food in the Last Year: Never true  Transportation Needs: No Transportation Needs (08/15/2022)   PRAPARE - Administrator, Civil Service (Medical): No    Lack of Transportation (Non-Medical): No  Physical Activity: Sufficiently Active (08/15/2022)   Exercise Vital Sign  Days of Exercise per Week: 5 days    Minutes of Exercise per Session: 30 min  Stress: No Stress Concern Present (08/15/2022)   [employer] of Occupational Health - Occupational Stress Questionnaire    Feeling of Stress : Not at all  Social Connections: Moderately Integrated (08/15/2022)   Social Connection and Isolation Panel [NHANES]    Frequency of Communication with Friends and Family: More than three times a week    Frequency of Social Gatherings with Friends and Family: More than three times a week    Attends Religious Services: More than 4 times per year    Active Member of [employer] or Organizations: No    Attends Banker Meetings: Never    Marital Status: Married  Catering Manager Violence: Not At Risk (08/15/2022)   Humiliation, Afraid, Rape, and Kick questionnaire    Fear of Current or Ex-Partner: No    Emotionally Abused: No    Physically Abused: No    Sexually Abused: No    Outpatient Medications Prior to Visit   Medication Sig Dispense Refill   Cyanocobalamin  (VITAMIN B 12 PO) Take by mouth.     METAMUCIL FIBER PO Take by mouth.     No facility-administered medications prior to visit.    No Known Allergies  Review of Systems *** PE;    08/15/2022    9:28 AM 01/16/2022    8:05 AM 01/13/2021    9:01 AM  Vitals with BMI  Height  5' 8 5' 9  Weight 193 lbs 193 lbs 6 oz 198 lbs  BMI  29.41 29.23  Systolic  125 120  Diastolic  78 77  Pulse  69 72     *** Pertinent labs:  Lab Results  Component Value Date   TSH 1.20 05/20/2015   Lab Results  Component Value Date   WBC 6.5 01/16/2022   HGB 14.7 01/16/2022   HCT 43.7 01/16/2022   MCV 98.6 01/16/2022   PLT 315.0 01/16/2022   Lab Results  Component Value Date   CREATININE 1.03 01/16/2022   BUN 30 (H) 01/16/2022   NA 142 01/16/2022   K 4.5 01/16/2022   CL 107 01/16/2022   CO2 26 01/16/2022   Lab Results  Component Value Date   ALT 20 01/16/2022   AST 28 01/16/2022   ALKPHOS 28 (L) 01/16/2022   BILITOT 1.2 01/16/2022   Lab Results  Component Value Date   CHOL 101 01/16/2022   Lab Results  Component Value Date   HDL 50.70 01/16/2022   Lab Results  Component Value Date   LDLCALC 40 01/16/2022   Lab Results  Component Value Date   TRIG 52.0 01/16/2022   Lab Results  Component Value Date   CHOLHDL 2 01/16/2022   Lab Results  Component Value Date   PSA 0.44 02/27/2013   PSA 0.43 08/09/2010   PSA 0.39 08/05/2009   ASSESSMENT AND PLAN:   No problem-specific Assessment & Plan notes found for this encounter.  #1 health maintenance exam: Reviewed age and gender appropriate health maintenance issues (prudent diet, regular exercise, health risks of tobacco and excessive alcohol, use of seatbelts, fire alarms in home, use of sunscreen).  Also reviewed age and gender appropriate health screening as well as vaccine recommendations. Vaccines: shingrix->rx to pharmacy today.  Otherwise all utd. Labs:  cbc, lipids,  cmet Prostate ca screening: no hx of abnormal PSAs.  No further prostate ca screening indicated due to age. Colon ca screening: normal  colonoscopy 2011.  Pt has declined any further colon ca screening.  An After Visit Summary was printed and given to the patient.  FOLLOW UP:  No follow-ups on file.  Signed:  Gerlene Hockey, MD           01/21/2023

## 2023-01-31 ENCOUNTER — Encounter: Payer: Medicare HMO | Admitting: Family Medicine

## 2023-02-01 ENCOUNTER — Encounter: Payer: Self-pay | Admitting: Family Medicine

## 2023-02-01 ENCOUNTER — Ambulatory Visit: Payer: Medicare HMO | Admitting: Family Medicine

## 2023-02-01 VITALS — BP 135/80 | HR 76 | Temp 97.6°F | Ht 68.5 in | Wt 180.2 lb

## 2023-02-01 DIAGNOSIS — Z0000 Encounter for general adult medical examination without abnormal findings: Secondary | ICD-10-CM

## 2023-02-01 DIAGNOSIS — Z13220 Encounter for screening for lipoid disorders: Secondary | ICD-10-CM

## 2023-02-01 DIAGNOSIS — D229 Melanocytic nevi, unspecified: Secondary | ICD-10-CM | POA: Diagnosis not present

## 2023-02-01 DIAGNOSIS — Z0001 Encounter for general adult medical examination with abnormal findings: Secondary | ICD-10-CM | POA: Diagnosis not present

## 2023-02-01 DIAGNOSIS — L219 Seborrheic dermatitis, unspecified: Secondary | ICD-10-CM | POA: Diagnosis not present

## 2023-02-01 DIAGNOSIS — L821 Other seborrheic keratosis: Secondary | ICD-10-CM

## 2023-02-01 LAB — COMPREHENSIVE METABOLIC PANEL
ALT: 21 U/L (ref 0–53)
AST: 30 U/L (ref 0–37)
Albumin: 3.9 g/dL (ref 3.5–5.2)
Alkaline Phosphatase: 29 U/L — ABNORMAL LOW (ref 39–117)
BUN: 20 mg/dL (ref 6–23)
CO2: 27 meq/L (ref 19–32)
Calcium: 8.9 mg/dL (ref 8.4–10.5)
Chloride: 105 meq/L (ref 96–112)
Creatinine, Ser: 0.89 mg/dL (ref 0.40–1.50)
GFR: 79.73 mL/min (ref >60.00)
Glucose, Bld: 98 mg/dL (ref 70–99)
Potassium: 4.5 meq/L (ref 3.5–5.1)
Sodium: 139 meq/L (ref 135–145)
Total Bilirubin: 1.1 mg/dL (ref 0.2–1.2)
Total Protein: 6.6 g/dL (ref 6.0–8.3)

## 2023-02-01 LAB — CBC
HCT: 43 % (ref 39.0–52.0)
Hemoglobin: 14.3 g/dL (ref 13.0–17.0)
MCHC: 33.1 g/dL (ref 30.0–36.0)
MCV: 99 fL (ref 78.0–100.0)
Platelets: 306 10*3/uL (ref 150.0–400.0)
RBC: 4.34 Mil/uL (ref 4.22–5.81)
RDW: 14.5 % (ref 11.5–15.5)
WBC: 7.6 10*3/uL (ref 4.0–10.5)

## 2023-02-01 LAB — LIPID PANEL
Cholesterol: 127 mg/dL (ref 0–200)
HDL: 56.7 mg/dL (ref >39.00)
LDL Cholesterol: 63 mg/dL (ref 0–99)
NonHDL: 69.92
Total CHOL/HDL Ratio: 2
Triglycerides: 37 mg/dL (ref 0.0–149.0)
VLDL: 7.4 mg/dL (ref 0.0–40.0)

## 2023-02-01 MED ORDER — KETOCONAZOLE 2 % EX SHAM: MEDICATED_SHAMPOO | CUTANEOUS | 1 refills | Status: AC

## 2023-02-01 NOTE — Progress Notes (Signed)
Office Note 02/01/2023  CC:  Chief Complaint  Patient presents with   Annual Exam    Non fasting. Spots of concerns on back     HPI:  Patient is a 83 y.o. male who is here for annual health maintenance exam and discuss skin concern.  Paul Knight feels good.  He has a little bit of instability when walking ever since he had rupture of his peroneal tendons on the right ankle a couple of years ago. He wears a soft brace.  He does not have falls. Ankle pain.  He exercises daily, lots of stretching and strengthening exercises, also stationary bike.  He has a couple of skin lesions on his back that have turned dark and grown. He has a little bit of dry skin on the right cheek that is pink and has a couple of crusty, slightly greasy papules.  No pain or itching.  He has many cherry angiomata and seborrheic keratosis.    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 Tarsal tunnel syndrome.  Ortho and neuro could not determine dx.     Osteoarthritis, multiple sites    shoulder esp   Peroneal tendon rupture    right   Squamous cell carcinoma of skin 06/11/2016   right cheek - CX3 + 5FU    Past Surgical History:  Procedure Laterality Date   APPENDECTOMY  1970s   COLONOSCOPY  01/08/2009   Normal; recall 10 yrs   REVERSE SHOULDER ARTHROPLASTY Right 03/19/2018   Procedure: REVERSE SHOULDER ARTHROPLASTY;  Surgeon: Bjorn Pippin, MD;  Location: [facility];  Service: Orthopedics;  Laterality: Right;  with exparel   TONSILLECTOMY  as a child    Family History  Problem Relation Age of Onset   Heart failure Mother    Heart attack Mother        Deceased, 54   Other Father        Deceased, 53   Heart disease Brother    Healthy Son    Thyroid disease Daughter    Cancer Daughter        ovarian    Social History    Socioeconomic History   Marital status: Married    Spouse name: Not on file   Number of children: 4   Years of education: Not on file   Highest education level: Not on file  Occupational History   Occupation: Retired  Tobacco Use   Smoking status: Former    Current packs/day: 0.25    Average packs/day: 0.3 packs/day for 10.0 years (2.5 ttl pk-yrs)    Types: Cigarettes   Smokeless tobacco: Never   Tobacco comments:    qiut  when 83 years old  Vaping Use   Vaping status: Never Used  Substance and Sexual Activity   Alcohol use: Yes    Alcohol/week: 1.0 - 2.0 standard drink of alcohol    Types: 1 - 2 Glasses of wine per week    Comment: 2 glasses of wine nightly x 5 years   Drug use: No   Sexual activity: Not Currently  Other Topics Concern   Not on file  Social History Narrative   Married, 4 children in the [place] area.  Grandchildren x 7.   Occupation: Curator for [employer], retired.   No tobacco.  Alcohol 2 glasses wine/day.   Exercise: [employer], walking in park, stationary bike at home: 3 days per week for 1 hour.   Social Drivers of Corporate investment banker Strain: Low Risk  (08/15/2022)   Overall Financial Resource Strain (CARDIA)    Difficulty of Paying Living Expenses: Not hard at all  Food Insecurity: No Food Insecurity (08/15/2022)   Hunger Vital Sign    Worried About Running Out of Food in the Last Year: Never true    Ran Out of Food in the Last Year: Never true  Transportation Needs: No Transportation Needs (08/15/2022)   PRAPARE - Administrator, Civil Service (Medical): No    Lack of Transportation (Non-Medical): No  Physical Activity: Sufficiently Active (08/15/2022)   Exercise Vital Sign    Days of Exercise per Week: 5 days    Minutes of Exercise per Session: 30 min  Stress: No Stress Concern Present (08/15/2022)   [employer] of Occupational Health - Occupational Stress Questionnaire    Feeling of Stress : Not at all  Social  Connections: Moderately Integrated (08/15/2022)   Social Connection and Isolation Panel [NHANES]    Frequency of Communication with Friends and Family: More than three times a week    Frequency of Social Gatherings with Friends and Family: More than three times a week    Attends Religious Services: More than 4 times per year    Active Member of [employer] or Organizations: No    Attends Banker Meetings: Never    Marital Status: Married  Catering manager Violence: Not At Risk (08/15/2022)   Humiliation, Afraid, Rape, and Kick questionnaire    Fear of Current or Ex-Partner: No    Emotionally Abused: No    Physically Abused: No    Sexually Abused: No    Outpatient Medications Prior to Visit  Medication Sig Dispense Refill   ASPIRIN 81 PO Take by mouth.     Cyanocobalamin (VITAMIN B 12 PO) Take by mouth.     METAMUCIL FIBER PO Take by mouth.     OMEPRAZOLE PO Take by mouth. OTC     No facility-administered medications prior to visit.    No Known Allergies  Review of Systems  Constitutional:  Negative for appetite change, chills, fatigue and fever.  HENT:  Negative for congestion, dental problem, ear pain and sore throat.   Eyes:  Negative for discharge, redness and visual disturbance.  Respiratory:  Negative for cough, chest tightness, shortness of breath and wheezing.   Cardiovascular:  Negative for chest pain, palpitations and leg swelling.  Gastrointestinal:  Negative for abdominal pain, blood in stool, diarrhea, nausea and vomiting.  Genitourinary:  Negative for difficulty urinating, dysuria, flank pain, frequency, hematuria and urgency.  Musculoskeletal:  Negative for arthralgias, back pain, joint swelling, myalgias and neck stiffness.  Skin:  Positive for rash (face). Negative for pallor.  Neurological:  Negative for dizziness, speech difficulty, weakness and headaches.  Hematological:  Negative for adenopathy. Does not bruise/bleed easily.  Psychiatric/Behavioral:   Negative for confusion and sleep disturbance. The patient is not nervous/anxious.     PE;    02/01/2023    1:07 PM 08/15/2022    9:28 AM 01/16/2022    8:05 AM  Vitals with BMI  Height 5' 8.5"  5\' 8"   Weight 180 lbs 3 oz 193 lbs 193 lbs 6 oz  BMI 27  29.41  Systolic 135  125  Diastolic 80  78  Pulse 76  69    Gen: Alert, well appearing.  Patient is oriented to person, place, time, and situation. AFFECT: pleasant, lucid thought and speech. ENT: Ears: EACs clear, normal epithelium.  TMs with good light reflex and landmarks bilaterally.  Eyes: no injection, icteris, swelling, or exudate.  EOMI, PERRLA. Nose: no drainage or turbinate edema/swelling.  No injection or focal lesion.  Mouth: lips without lesion/swelling.  Oral mucosa pink and moist.  Dentition intact and without obvious caries or gingival swelling.  Oropharynx without erythema, exudate, or swelling.  Neck: supple/nontender.  No LAD, mass, or TM.  Carotid pulses 2+ bilaterally, without bruits. CV: RRR, no m/r/g.   LUNGS: CTA bilat, nonlabored resps, good aeration in all lung fields. ABD: soft, NT, ND, BS normal.  No hepatospenomegaly or mass.  No bruits. EXT: no clubbing, cyanosis, or edema.  Musculoskeletal: Right ankle with significantly increased laxity with passive inversion.  He has some slight weakness with inversion and eversion of right ankle.  No joint swelling, erythema, warmth, or tenderness.  ROM of all joints intact. Skin -face has some scattered pinkish flaky discoloration, some waxy papules dispersed through these.  Right cheek is the most affected area.  Other areas very subtle--creases of nose, between the eyebrows. He has many cherry angiomata and seborrheic keratosis lesions on his back.  There are 2 dark brown flat plaques in the midline of the back that appear somewhat like nevi. 1 measures 14 mm long and 7 mm wide.  The other is 7 mm long and 3 mm wide.  Variable pigment in each of these, borders and shape  irregular.  Pertinent labs:  Lab Results  Component Value Date   TSH 1.20 05/20/2015   Lab Results  Component Value Date   WBC 6.5 01/16/2022   HGB 14.7 01/16/2022   HCT 43.7 01/16/2022   MCV 98.6 01/16/2022   PLT 315.0 01/16/2022   Lab Results  Component Value Date   CREATININE 1.03 01/16/2022   BUN 30 (H) 01/16/2022   NA 142 01/16/2022   K 4.5 01/16/2022   CL 107 01/16/2022   CO2 26 01/16/2022   Lab Results  Component Value Date   ALT 20 01/16/2022   AST 28 01/16/2022   ALKPHOS 28 (L) 01/16/2022   BILITOT 1.2 01/16/2022   Lab Results  Component Value Date   CHOL 101 01/16/2022   Lab Results  Component Value Date   HDL 50.70 01/16/2022   Lab Results  Component Value Date   LDLCALC 40 01/16/2022   Lab Results  Component Value Date   TRIG 52.0 01/16/2022   Lab Results  Component Value Date   CHOLHDL 2 01/16/2022   Lab Results  Component Value Date   PSA 0.44 02/27/2013   PSA 0.43 08/09/2010   PSA 0.39 08/05/2009   ASSESSMENT AND PLAN:   #1 health maintenance exam: Reviewed age and gender appropriate health maintenance issues (prudent diet, regular exercise, health risks of tobacco and excessive alcohol, use of seatbelts, fire alarms in home, use of sunscreen).  Also reviewed age and gender appropriate health screening as well as vaccine recommendations. Vaccines: ALL utd. Labs:  cbc, lipids, cmet Prostate ca screening: no hx of abnormal PSAs.  No further prostate ca screening indicated due to age. Colon ca screening: normal colonoscopy 2011.  Pt has declined any further colon ca screening.  #2 seborrheic dermatitis on face. Ketoconazole 2% shampoo prescribed.  Apply 3 times a week.  3.  Skin lesions on back. Two of  these appear to be atypical seborrheic keratoses versus atypical nevi. I will have him return to have both of these excised and sent to pathology.  An After Visit Summary was printed and given to the patient.  FOLLOW UP:  Return for  1 yr for CPE.  Also, make appt with me for skin lesion removal at your earliest convenience.  Signed:  Santiago Bumpers, MD           02/01/2023

## 2023-02-01 NOTE — Patient Instructions (Signed)
You can stop taking aspirin.   Health Maintenance, Male Adopting a healthy lifestyle and getting preventive care are important in promoting health and wellness. Ask your health care provider about: The right schedule for you to have regular tests and exams. Things you can do on your own to prevent diseases and keep yourself healthy. What should I know about diet, weight, and exercise? Eat a healthy diet  Eat a diet that includes plenty of vegetables, fruits, low-fat dairy products, and lean protein. Do not eat a lot of foods that are high in solid fats, added sugars, or sodium. Maintain a healthy weight Body mass index (BMI) is a measurement that can be used to identify possible weight problems. It estimates body fat based on height and weight. Your health care provider can help determine your BMI and help you achieve or maintain a healthy weight. Get regular exercise Get regular exercise. This is one of the most important things you can do for your health. Most adults should: Exercise for at least 150 minutes each week. The exercise should increase your heart rate and make you sweat (moderate-intensity exercise). Do strengthening exercises at least twice a week. This is in addition to the moderate-intensity exercise. Spend less time sitting. Even light physical activity can be beneficial. Watch cholesterol and blood lipids Have your blood tested for lipids and cholesterol at 83 years of age, then have this test every 5 years. You may need to have your cholesterol levels checked more often if: Your lipid or cholesterol levels are high. You are older than 83 years of age. You are at high risk for heart disease. What should I know about cancer screening? Many types of cancers can be detected early and may often be prevented. Depending on your health history and family history, you may need to have cancer screening at various ages. This may include screening for: Colorectal cancer. Prostate  cancer. Skin cancer. Lung cancer. What should I know about heart disease, diabetes, and high blood pressure? Blood pressure and heart disease High blood pressure causes heart disease and increases the risk of stroke. This is more likely to develop in people who have high blood pressure readings or are overweight. Talk with your health care provider about your target blood pressure readings. Have your blood pressure checked: Every 3-5 years if you are 64-44 years of age. Every year if you are 57 years old or older. If you are between the ages of 89 and 34 and are a current or former smoker, ask your health care provider if you should have a one-time screening for abdominal aortic aneurysm (AAA). Diabetes Have regular diabetes screenings. This checks your fasting blood sugar level. Have the screening done: Once every three years after age 12 if you are at a normal weight and have a low risk for diabetes. More often and at a younger age if you are overweight or have a high risk for diabetes. What should I know about preventing infection? Hepatitis B If you have a higher risk for hepatitis B, you should be screened for this virus. Talk with your health care provider to find out if you are at risk for hepatitis B infection. Hepatitis C Blood testing is recommended for: Everyone born from [birth year] through 1965. Anyone with known risk factors for hepatitis C. Sexually transmitted infections (STIs) You should be screened each year for STIs, including gonorrhea and chlamydia, if: You are sexually active and are younger than 83 years of age. You are older  than 83 years of age and your health care provider tells you that you are at risk for this type of infection. Your sexual activity has changed since you were last screened, and you are at increased risk for chlamydia or gonorrhea. Ask your health care provider if you are at risk. Ask your health care provider about whether you are at high risk for HIV.  Your health care provider may recommend a prescription medicine to help prevent HIV infection. If you choose to take medicine to prevent HIV, you should first get tested for HIV. You should then be tested every 3 months for as long as you are taking the medicine. Follow these instructions at home: Alcohol use Do not drink alcohol if your health care provider tells you not to drink. If you drink alcohol: Limit how much you have to 0-2 drinks a day. Know how much alcohol is in your drink. In the [place] one drink equals one 12 oz bottle of beer (355 mL), one 5 oz glass of wine (148 mL), or one 1 oz glass of hard liquor (44 mL). Lifestyle Do not use any products that contain nicotine or tobacco. These products include cigarettes, chewing tobacco, and vaping devices, such as e-cigarettes. If you need help quitting, ask your health care provider. Do not use street drugs. Do not share needles. Ask your health care provider for help if you need support or information about quitting drugs. General instructions Schedule regular health, dental, and eye exams. Stay current with your vaccines. Tell your health care provider if: You often feel depressed. You have ever been abused or do not feel safe at home. Summary Adopting a healthy lifestyle and getting preventive care are important in promoting health and wellness. Follow your health care provider's instructions about healthy diet, exercising, and getting tested or screened for diseases. Follow your health care provider's instructions on monitoring your cholesterol and blood pressure. This information is not intended to replace advice given to you by your health care provider. Make sure you discuss any questions you have with your health care provider. Document Revised: 05/16/2020 Document Reviewed: 05/16/2020 Elsevier Patient Education  2024 [employer].

## 2023-02-03 ENCOUNTER — Encounter: Payer: Self-pay | Admitting: Family Medicine

## 2023-02-07 ENCOUNTER — Ambulatory Visit (INDEPENDENT_AMBULATORY_CARE_PROVIDER_SITE_OTHER): Payer: Medicare HMO | Admitting: Family Medicine

## 2023-02-07 ENCOUNTER — Encounter: Payer: Self-pay | Admitting: Family Medicine

## 2023-02-07 VITALS — BP 112/73 | HR 80 | Wt 177.4 lb

## 2023-02-07 DIAGNOSIS — L820 Inflamed seborrheic keratosis: Secondary | ICD-10-CM | POA: Diagnosis not present

## 2023-02-07 DIAGNOSIS — L989 Disorder of the skin and subcutaneous tissue, unspecified: Secondary | ICD-10-CM | POA: Diagnosis not present

## 2023-02-07 NOTE — Progress Notes (Signed)
OFFICE VISIT  02/07/2023  CC:  Chief Complaint  Patient presents with   Procedure    Patient is a 83 y.o. male who presents for removal of skin lesions on back. I saw him for these on 02/01/23. A/P as of that visit: " Skin lesions on back. Two of these appear to be atypical seborrheic keratoses versus atypical nevi. I will have him return to have both of these excised and sent to pathology."  INTERIM HX: Feeling well.  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 Tarsal tunnel syndrome.  Ortho and neuro could not determine dx.     Osteoarthritis, multiple sites    shoulder esp   Peroneal tendon rupture    right   Squamous cell carcinoma of skin 06/11/2016   right cheek - CX3 + 5FU    Past Surgical History:  Procedure Laterality Date   APPENDECTOMY  1970s   COLONOSCOPY  01/08/2009   Normal; recall 10 yrs   REVERSE SHOULDER ARTHROPLASTY Right 03/19/2018   Procedure: REVERSE SHOULDER ARTHROPLASTY;  Surgeon: Bjorn Pippin, MD;  Location: [facility];  Service: Orthopedics;  Laterality: Right;  with exparel   TONSILLECTOMY  as a child    Outpatient Medications Prior to Visit  Medication Sig Dispense Refill   ASPIRIN 81 PO Take by mouth.     Cyanocobalamin (VITAMIN B 12 PO) Take by mouth.     ketoconazole (NIZORAL) 2 % shampoo Apply to affected areas three times per week (lather and rinse) 120 mL 1   METAMUCIL FIBER PO Take by mouth.     OMEPRAZOLE PO Take by mouth. OTC     No facility-administered medications prior to visit.    No Known Allergies  Review of Systems  As per HPI  PE:    02/07/2023    1:24 PM 02/01/2023    1:07 PM 08/15/2022    9:28 AM  Vitals with BMI  Height  5' 8.5"   Weight 177 lbs 6 oz 180 lbs 3 oz 193 lbs  BMI 26.58 27   Systolic 112 135   Diastolic 73 80   Pulse 80 76       Physical Exam  Gen: Alert, well appearing.  Patient is oriented to person, place, time, and situation. There are 2 dark brown flat plaques just to the left of the midline of the thoracic spine that are of variable pigment, have irregular shape and borders.   One lesion measures 14 mm long and 7 mm wide.  The other is 7 mm long and 3 mm wide.    LABS:  none  IMPRESSION AND PLAN:  Pigmented skin lesions on the back. I used shave excision technique to remove each of these and send to Derm path today.  Procedure: skin lesion removal.  Consent obtained.  Each lesion area prepped and draped after being infiltrated with 2 mL of 1% lidocaine with epinephrine.  Sterile technique utilized to excise the lesion entirely by derma blade.  Minimal divot remaining in each site, no skin repair required.  No bleeding. No immediate complications.  Pt tolerated procedure well. Specimen sent to pathology.  Wound care instructions discussed.  An After Visit Summary was printed and given to the patient.  FOLLOW UP: Return for As needed.  Signed:  Santiago Bumpers, MD           02/07/2023

## 2023-02-12 ENCOUNTER — Ambulatory Visit: Payer: Self-pay | Admitting: Family Medicine

## 2023-02-12 NOTE — Telephone Encounter (Signed)
 Chief Complaint: Bleeding Symptoms: mole removal bleeding Frequency: since Thursday Pertinent Negatives: Patient denies redness, drainage, fever Disposition: [] ED /[] Urgent Care (no appt availability in office) / [] Appointment(In office/virtual)/ []  [facility]/ [x] Home Care/ [] Refused Recommended Disposition /[] [facility]/ []  Follow-up with PCP Additional Notes: Patient called in stating he had the top of a mole removed in the office on Thursday to be sent off for examination, and it has been bleeding since Thursday. Patient states he is keeping it cleaned and covered daily, but notices upon removal of bandaid that there are spots of blood on the bandaid. Patient denies fever, redness, drainage, any s/s of infection. Advised patient to continue keeping area clean and covered, changing the dressing daily. Advised patient to use sterile, nonstick bandaid. Advised patient to call back if symptoms do not improve by 1 week, or if he develops s/s of infection (redness, drainage, fever). Patient verbalized understanding.    Copied from CRM [id number]. Topic: Clinical - Red Word Triage >> Feb 12, 2023 11:08 AM Rosina BIRCH wrote: Reason for CRM: patient called stating the doctor removed a mole off his back and now it want stop bleeding Reason for Disposition  Minor cut or scratch  Answer Assessment - Initial Assessment Questions 1. APPEARANCE of INJURY: What does the injury look like?      Mole removal by doctor  2. SIZE: How large is the cut?      N/a 3. BLEEDING: Is it bleeding now? If Yes, ask: Is it difficult to stop?      Spots of blood on bandaid 4. LOCATION: Where is the injury located?      back 5. ONSET: How long ago did the injury occur?      Thursday 6. MECHANISM: Tell me how it happened.      Doctor removed the head of a mole to send in for biopsy  Protocols used: Cuts and Lacerations-A-AH

## 2023-02-13 NOTE — Telephone Encounter (Signed)
Noted. Agree with recommendations.  

## 2023-02-14 LAB — DERMATOLOGY PATHOLOGY

## 2023-07-08 ENCOUNTER — Other Ambulatory Visit: Payer: Self-pay

## 2023-07-08 DIAGNOSIS — Z0184 Encounter for antibody response examination: Secondary | ICD-10-CM

## 2023-08-01 ENCOUNTER — Other Ambulatory Visit (INDEPENDENT_AMBULATORY_CARE_PROVIDER_SITE_OTHER)

## 2023-08-01 DIAGNOSIS — Z0184 Encounter for antibody response examination: Secondary | ICD-10-CM

## 2023-08-02 ENCOUNTER — Ambulatory Visit (INDEPENDENT_AMBULATORY_CARE_PROVIDER_SITE_OTHER): Admitting: Family Medicine

## 2023-08-02 ENCOUNTER — Encounter: Payer: Self-pay | Admitting: Family Medicine

## 2023-08-02 VITALS — BP 94/59 | HR 71 | Temp 98.2°F | Ht 68.5 in | Wt 171.8 lb

## 2023-08-02 DIAGNOSIS — M25552 Pain in left hip: Secondary | ICD-10-CM | POA: Diagnosis not present

## 2023-08-02 NOTE — Progress Notes (Signed)
 OFFICE VISIT  08/04/2023  CC:  Chief Complaint  Patient presents with   Hip Pain    Left; c/o painful to touch; occasional use of Naproxen    Patient is a 83 y.o. male who presents for left hip pain.  HPI: For about 2 years he is having pain in the lateral aspect of the left hip.  He points locally over the greater trochanter. It is mild and nagging, minimally affects his activities of daily living. 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  This has altered his gait. No groin pain.  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 Tarsal tunnel syndrome.  Ortho and neuro could not determine dx.     Osteoarthritis, multiple sites    shoulder esp   Peroneal tendon rupture    right   Squamous cell carcinoma of skin 06/11/2016   right cheek - CX3 + 5FU    Past Surgical History:  Procedure Laterality Date   APPENDECTOMY  1970s   COLONOSCOPY  01/08/2009   Normal; recall 10 yrs   REVERSE SHOULDER ARTHROPLASTY Right 03/19/2018   Procedure: REVERSE SHOULDER ARTHROPLASTY;  Surgeon: Cristy Bonner DASEN, MD;  Location: [facility];  Service: Orthopedics;  Laterality: Right;  with exparel    TONSILLECTOMY  as a child    Outpatient Medications Prior to Visit  Medication Sig Dispense Refill   METAMUCIL FIBER PO Take by mouth.     OMEPRAZOLE  PO Take by mouth. OTC     ASPIRIN 81 PO Take by mouth. (Patient not taking: Reported on 08/02/2023)     Cyanocobalamin  (VITAMIN B 12 PO) Take by mouth. (Patient not taking: Reported on 08/02/2023)     ketoconazole  (NIZORAL ) 2 % shampoo Apply to affected areas three times per week (lather and rinse) 120 mL 1   No facility-administered medications prior to visit.    No Known Allergies  Review of Systems  As per HPI  PE:    08/02/2023    3:07 PM 02/07/2023     1:24 PM 02/01/2023    1:07 PM  Vitals with BMI  Height 5' 8.5  5' 8.5  Weight 171 lbs 13 oz 177 lbs 6 oz 180 lbs 3 oz  BMI 25.74 26.58 27  Systolic 94 112 135  Diastolic 59 73 80  Pulse 71 80 76     Physical Exam  General: Alert and well-appearing. Antalgic gait. Single-leg standing positive on the left.  He has focal tenderness to palpation over the left greater trochanter. Mild pain with resisted left hip abduction. Left hip range of motion fully intact actively and passively.  LABS:  Last metabolic panel Lab Results  Component Value Date   GLUCOSE 98 02/01/2023   NA 139 02/01/2023   K 4.5 02/01/2023   CL 105 02/01/2023   CO2 27 02/01/2023   BUN 20 02/01/2023   CREATININE 0.89 02/01/2023   GFR 79.73 02/01/2023   CALCIUM 8.9 02/01/2023   PROT 6.6 02/01/2023   ALBUMIN 3.9 02/01/2023   BILITOT 1.1 02/01/2023   ALKPHOS 29 (L) 02/01/2023   AST 30 02/01/2023   ALT 21 02/01/2023   ANIONGAP 10 07/12/2019   IMPRESSION AND PLAN:  Chronic left hip pain, trochanteric pain syndrome. Home exercises discussed and handout given.  Consider formal PT and/or steroid injection in the future.  An After Visit Summary was printed and given to the patient.  FOLLOW UP: Return if symptoms worsen or fail to improve.  Signed:  Gerlene Hockey, MD           08/04/2023

## 2023-08-02 NOTE — Patient Instructions (Signed)
 Hip Bursitis Rehab Ask your health care provider which exercises are safe for you. Do exercises exactly as told by your health care provider and adjust them as directed. It is normal to feel mild stretching, pulling, tightness, or discomfort as you do these exercises. Stop right away if you feel sudden pain or your pain gets worse. Do not begin these exercises until told by your health care provider. Stretching exercise This exercise warms up your muscles and joints and improves the movement and flexibility of your hip. This exercise also helps to relieve pain and stiffness. Iliotibial band stretch An iliotibial band is a strong band of muscle tissue that runs from the outer side of your hip to the outer side of your thigh and knee. Lie on your side with your left / right leg in the top position. Bend your left / right knee and grab your ankle. Stretch out your bottom arm to help you balance. Slowly bring your knee back so your thigh is slightly behind your body. Slowly lower your knee toward the floor until you feel a gentle stretch on the outside of your left / right thigh. If you do not feel a stretch and your knee will not lower more toward the floor, place the heel of your other foot on top of your knee and pull your knee down toward the floor with your foot. Hold this position for __________ seconds. Slowly return to the starting position. Repeat __________ times. Complete this exercise __________ times a day. Strengthening exercises These exercises build strength and endurance in your hip and pelvis. Endurance is the ability to use your muscles for a long time, even after they get tired. Bridge This exercise strengthens the muscles that move your thigh backward (hip extensors). Lie on your back on a firm surface with your knees bent and your feet flat on the floor. Tighten your buttocks muscles and lift your buttocks off the floor until your trunk is level with your thighs. Do not arch your  back. You should feel the muscles working in your buttocks and the back of your thighs. If you do not feel these muscles, slide your feet 1-2 inches (2.5-5 cm) farther away from your buttocks. If this exercise is too easy, try doing it with your arms crossed over your chest. Hold this position for __________ seconds. Slowly lower your hips to the starting position. Let your muscles relax completely after each repetition. Repeat __________ times. Complete this exercise __________ times a day. Squats This exercise strengthens the muscles in front of your thigh and knee (quadriceps). Stand in front of a table, with your feet and knees pointing straight ahead. You may rest your hands on the table for balance but not for support. Slowly bend your knees and lower your hips like you are going to sit in a chair. Keep your weight over your heels, not over your toes. Keep your lower legs upright so they are parallel with the table legs. Do not let your hips go lower than your knees. Do not bend lower than told by your health care provider. If your hip pain increases, do not bend as low. Hold the squat position for __________ seconds. Slowly push with your legs to return to standing. Do not use your hands to pull yourself to standing. Repeat __________ times. Complete this exercise __________ times a day. Hip hike  Stand sideways on a bottom step. Stand on your left / right leg with your other foot unsupported next to  the step. You can hold on to the railing or wall for balance if needed. Keep your knees straight and your torso square. Then lift your left / right hip up toward the ceiling. Hold this position for __________ seconds. Slowly let your left / right hip lower toward the floor, past the starting position. Your foot should get closer to the floor. Do not lean or bend your knees. Repeat __________ times. Complete this exercise __________ times a day. Single leg stand This exercise increases  your balance. Without shoes, stand near a railing or in a doorway. You may hold on to the railing or door frame as needed for balance. Squeeze your left / right buttock muscles, then lift up your other foot. Do not let your left / right hip push out to the side. It is helpful to stand in front of a mirror for this exercise so you can watch your hip. Hold this position for __________ seconds. Repeat __________ times. Complete this exercise __________ times a day. This information is not intended to replace advice given to you by your health care provider. Make sure you discuss any questions you have with your health care provider. Document Revised: 12/07/2020 Document Reviewed: 12/07/2020 Elsevier Patient Education  2024 [employer].

## 2023-08-03 LAB — MEASLES/MUMPS/RUBELLA IMMUNITY
Mumps IgG: <9 [AU]/ml — ABNORMAL LOW
Rubella: 31.1 {index}
Rubeola IgG: >300 [AU]/ml

## 2023-08-04 ENCOUNTER — Ambulatory Visit: Payer: Self-pay | Admitting: Family Medicine

## 2023-09-18 ENCOUNTER — Ambulatory Visit: Admitting: *Deleted

## 2023-09-18 DIAGNOSIS — Z0000 Encounter for general adult medical examination without abnormal findings: Secondary | ICD-10-CM | POA: Diagnosis not present

## 2023-09-18 NOTE — Patient Instructions (Signed)
 Paul Knight , Thank you for taking time to come for your Medicare Wellness Visit. I appreciate your ongoing commitment to your health goals. Please review the following plan we discussed and let me know if I can assist you in the future.   Screening recommendations/referrals: Colonoscopy: no longer required Recommended yearly ophthalmology/optometry visit for glaucoma screening and checkup Recommended yearly dental visit for hygiene and checkup  Vaccinations: Influenza vaccine: Education provided Pneumococcal vaccine: up to date Tdap vaccine: Education provided Shingles vaccine: up to date        Preventive Care 65 Years and Older, Male Preventive care refers to lifestyle choices and visits with your health care provider that can promote health and wellness. What does preventive care include? A yearly physical exam. This is also called an annual well check. Dental exams once or twice a year. Routine eye exams. Ask your health care provider how often you should have your eyes checked. Personal lifestyle choices, including: Daily care of your teeth and gums. Regular physical activity. Eating a healthy diet. Avoiding tobacco and drug use. Limiting alcohol use. Practicing safe sex. Taking low doses of aspirin every day. Taking vitamin and mineral supplements as recommended by your health care provider. What happens during an annual well check? The services and screenings done by your health care provider during your annual well check will depend on your age, overall health, lifestyle risk factors, and family history of disease. Counseling  Your health care provider may ask you questions about your: Alcohol use. Tobacco use. Drug use. Emotional well-being. Home and relationship well-being. Sexual activity. Eating habits. History of falls. Memory and ability to understand (cognition). Work and work Astronomer. Screening  You may have the following tests or  measurements: Height, weight, and BMI. Blood pressure. Lipid and cholesterol levels. These may be checked every 5 years, or more frequently if you are over 87 years old. Skin check. Lung cancer screening. You may have this screening every year starting at age 29 if you have a 30-pack-year history of smoking and currently smoke or have quit within the past 15 years. Fecal occult blood test (FOBT) of the stool. You may have this test every year starting at age 83. Flexible sigmoidoscopy or colonoscopy. You may have a sigmoidoscopy every 5 years or a colonoscopy every 10 years starting at age 10. Prostate cancer screening. Recommendations will vary depending on your family history and other risks. Hepatitis C blood test. Hepatitis B blood test. Sexually transmitted disease (STD) testing. Diabetes screening. This is done by checking your blood sugar (glucose) after you have not eaten for a while (fasting). You may have this done every 1-3 years. Abdominal aortic aneurysm (AAA) screening. You may need this if you are a current or former smoker. Osteoporosis. You may be screened starting at age 77 if you are at high risk. Talk with your health care provider about your test results, treatment options, and if necessary, the need for more tests. Vaccines  Your health care provider may recommend certain vaccines, such as: Influenza vaccine. This is recommended every year. Tetanus, diphtheria, and acellular pertussis (Tdap, Td) vaccine. You may need a Td booster every 10 years. Zoster vaccine. You may need this after age 61. Pneumococcal 13-valent conjugate (PCV13) vaccine. One dose is recommended after age 36. Pneumococcal polysaccharide (PPSV23) vaccine. One dose is recommended after age 80. Talk to your health care provider about which screenings and vaccines you need and how often you need them. This information is not intended  to replace advice given to you by your health care provider. Make sure  you discuss any questions you have with your health care provider. Document Released: 01/21/2015 Document Revised: 09/14/2015 Document Reviewed: 10/26/2014 Elsevier Interactive Patient Education  2017 [employer].  Fall Prevention in the Home Falls can cause injuries. They can happen to people of all ages. There are many things you can do to make your home safe and to help prevent falls. What can I do on the outside of my home? Regularly fix the edges of walkways and driveways and fix any cracks. Remove anything that might make you trip as you walk through a door, such as a raised step or threshold. Trim any bushes or trees on the path to your home. Use bright outdoor lighting. Clear any walking paths of anything that might make someone trip, such as rocks or tools. Regularly check to see if handrails are loose or broken. Make sure that both sides of any steps have handrails. Any raised decks and porches should have guardrails on the edges. Have any leaves, snow, or ice cleared regularly. Use sand or salt on walking paths during winter. Clean up any spills in your garage right away. This includes oil or grease spills. What can I do in the bathroom? Use night lights. Install grab bars by the toilet and in the tub and shower. Do not use towel bars as grab bars. Use non-skid mats or decals in the tub or shower. If you need to sit down in the shower, use a plastic, non-slip stool. Keep the floor dry. Clean up any water  that spills on the floor as soon as it happens. Remove soap buildup in the tub or shower regularly. Attach bath mats securely with double-sided non-slip rug tape. Do not have throw rugs and other things on the floor that can make you trip. What can I do in the bedroom? Use night lights. Make sure that you have a light by your bed that is easy to reach. Do not use any sheets or blankets that are too big for your bed. They should not hang down onto the floor. Have a firm  chair that has side arms. You can use this for support while you get dressed. Do not have throw rugs and other things on the floor that can make you trip. What can I do in the kitchen? Clean up any spills right away. Avoid walking on wet floors. Keep items that you use a lot in easy-to-reach places. If you need to reach something above you, use a strong step stool that has a grab bar. Keep electrical cords out of the way. Do not use floor polish or wax that makes floors slippery. If you must use wax, use non-skid floor wax. Do not have throw rugs and other things on the floor that can make you trip. What can I do with my stairs? Do not leave any items on the stairs. Make sure that there are handrails on both sides of the stairs and use them. Fix handrails that are broken or loose. Make sure that handrails are as long as the stairways. Check any carpeting to make sure that it is firmly attached to the stairs. Fix any carpet that is loose or worn. Avoid having throw rugs at the top or bottom of the stairs. If you do have throw rugs, attach them to the floor with carpet tape. Make sure that you have a light switch at the top of the stairs and  the bottom of the stairs. If you do not have them, ask someone to add them for you. What else can I do to help prevent falls? Wear shoes that: Do not have high heels. Have rubber bottoms. Are comfortable and fit you well. Are closed at the toe. Do not wear sandals. If you use a stepladder: Make sure that it is fully opened. Do not climb a closed stepladder. Make sure that both sides of the stepladder are locked into place. Ask someone to hold it for you, if possible. Clearly mark and make sure that you can see: Any grab bars or handrails. First and last steps. Where the edge of each step is. Use tools that help you move around (mobility aids) if they are needed. These include: Canes. Walkers. Scooters. Crutches. Turn on the lights when you go  into a dark area. Replace any light bulbs as soon as they burn out. Set up your furniture so you have a clear path. Avoid moving your furniture around. If any of your floors are uneven, fix them. If there are any pets around you, be aware of where they are. Review your medicines with your doctor. Some medicines can make you feel dizzy. This can increase your chance of falling. Ask your doctor what other things that you can do to help prevent falls. This information is not intended to replace advice given to you by your health care provider. Make sure you discuss any questions you have with your health care provider. Document Released: 10/21/2008 Document Revised: 06/02/2015 Document Reviewed: 01/29/2014 Elsevier Interactive Patient Education  2017 [employer].

## 2023-09-18 NOTE — Progress Notes (Signed)
 Subjective:   Paul Knight is a 83 y.o. male who presents for Medicare Annual/Subsequent preventive examination.  Visit Complete: Virtual I connected with  Paul Knight on 09/18/23 by a audio enabled telemedicine application and verified that I am speaking with the correct person using two identifiers.  Patient Location: Home  Provider Location: Home Office  I discussed the limitations of evaluation and management by telemedicine. The patient expressed understanding and agreed to proceed.  Vital Signs: Because this visit was a virtual/telehealth visit, some criteria may be missing or patient reported. Any vitals not documented were not able to be obtained and vitals that have been documented are patient reported.   Cardiac Risk Factors include: advanced age (>45men, >49 women);male gender;family history of premature cardiovascular disease     Objective:    Today's Vitals   09/18/23 1549  PainSc: 0-No pain   There is no height or weight on file to calculate BMI.     09/18/2023    3:51 PM 08/15/2022    9:32 AM 07/12/2021    9:26 AM 06/08/2020   11:21 AM 07/12/2019    7:38 PM 03/19/2018   12:20 PM 03/19/2018    6:40 AM  Advanced Directives  Does Patient Have a Medical Advance Directive? Yes Yes Yes Yes No Yes  Yes   Type of Estate agent of [employer] Power of [place];Living will Healthcare Power of [employer] Power of [employer] Power of [place];Living will Healthcare Power of [place];Living will  Does patient want to make changes to medical advance directive?      No - Patient declined    Copy of Healthcare Power of Attorney in Chart? No - copy requested No - copy requested No - copy requested Yes - validated most recent copy scanned in chart (See row information)  No - copy requested  No - copy requested   Would patient like information on creating a medical advance directive?     No - Patient declined       Data saved with a  previous flowsheet row definition    Current Medications (verified) Outpatient Encounter Medications as of 09/18/2023  Medication Sig   ketoconazole  (NIZORAL ) 2 % shampoo Apply to affected areas three times per week (lather and rinse)   METAMUCIL FIBER PO Take by mouth.   OMEPRAZOLE  PO Take by mouth. OTC   ASPIRIN 81 PO Take by mouth. (Patient not taking: Reported on 09/18/2023)   Cyanocobalamin  (VITAMIN B 12 PO) Take by mouth. (Patient not taking: Reported on 09/18/2023)   No facility-administered encounter medications on file as of 09/18/2023.    Allergies (verified) Patient has no known allergies.   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 Tarsal tunnel syndrome.  Ortho and neuro could not determine dx.     Osteoarthritis, multiple sites    shoulder esp   Peroneal tendon rupture    right   Squamous cell carcinoma of skin 06/11/2016   right cheek - CX3 + 5FU   Past Surgical History:  Procedure Laterality Date   APPENDECTOMY  1970s   COLONOSCOPY  01/08/2009   Normal; recall 10 yrs   REVERSE SHOULDER ARTHROPLASTY Right 03/19/2018   Procedure: REVERSE SHOULDER ARTHROPLASTY;  Surgeon: Cristy Bonner DASEN, MD;  Location: [facility];  Service: Orthopedics;  Laterality: Right;  with exparel    TONSILLECTOMY  as a child   Family History  Problem Relation Age of Onset   Heart failure Mother    Heart attack Mother        Deceased, 55   Other Father        Deceased, 81   Heart disease Brother    Healthy Son    Thyroid  disease Daughter    Cancer Daughter        ovarian   Social History   Socioeconomic History   Marital status: Married    Spouse name: Not on file   Number of children: 4   Years of education: Not on file   Highest education level: Not on file  Occupational History   Occupation: Retired   Tobacco Use   Smoking status: Former    Current packs/day: 0.25    Average packs/day: 0.3 packs/day for 10.0 years (2.5 ttl pk-yrs)    Types: Cigarettes   Smokeless tobacco: Never   Tobacco comments:    qiut  when 83 years old  Vaping Use   Vaping status: Never Used  Substance and Sexual Activity   Alcohol use: Yes    Alcohol/week: 1.0 - 2.0 standard drink of alcohol    Types: 1 - 2 Glasses of wine per week    Comment: 2 glasses of wine nightly x 5 years   Drug use: No   Sexual activity: Not Currently  Other Topics Concern   Not on file  Social History Narrative   Married, 4 children in the [place] area.  Grandchildren x 7.   Occupation: Curator for [employer], retired.   No tobacco.  Alcohol 2 glasses wine/day.   Exercise: [employer], walking in park, stationary bike at home: 3 days per week for 1 hour.   Social Drivers of Corporate investment banker Strain: Low Risk  (09/18/2023)   Overall Financial Resource Strain (CARDIA)    Difficulty of Paying Living Expenses: Not hard at all  Food Insecurity: No Food Insecurity (09/18/2023)   Hunger Vital Sign    Worried About Running Out of Food in the Last Year: Never true    Ran Out of Food in the Last Year: Never true  Transportation Needs: No Transportation Needs (09/18/2023)   PRAPARE - Administrator, Civil Service (Medical): No    Lack of Transportation (Non-Medical): No  Physical Activity: Sufficiently Active (09/18/2023)   Exercise Vital Sign    Days of Exercise per Week: 5 days    Minutes of Exercise per Session: 30 min  Stress: No Stress Concern Present (09/18/2023)   [employer] of Occupational Health - Occupational Stress Questionnaire    Feeling of Stress: Not at all  Social Connections: Moderately Integrated (09/18/2023)   Social Connection and Isolation Panel    Frequency of Communication with Friends and Family: More than three times a week    Frequency of Social Gatherings with Friends and Family:  Twice a week    Attends Religious Services: More than 4 times per year    Active Member of [employer] or Organizations: No    Attends Engineer, structural: Never    Marital Status: Married    Tobacco Counseling Counseling given: Not Answered Tobacco comments: qiut  when 83 years old   Clinical Intake:  Pre-visit preparation completed: Yes  Pain : No/denies pain Pain Score: 0-No pain     Diabetes:  No  How often do you need to have someone help you when you read instructions, pamphlets, or other written materials from your doctor or pharmacy?: 1 - Never  Interpreter Needed?: No  Information entered by :: Mliss Graff LPN   Activities of Daily Living    09/18/2023    3:54 PM  In your present state of health, do you have any difficulty performing the following activities:  Hearing? 1  Vision? 0  Difficulty concentrating or making decisions? 0  Walking or climbing stairs? 0  Dressing or bathing? 0  Doing errands, shopping? 0  Preparing Food and eating ? N  Using the Toilet? N  In the past six months, have you accidently leaked urine? N  Do you have problems with loss of bowel control? N  Managing your Medications? N  Managing your Finances? N  Housekeeping or managing your Housekeeping? N    Patient Care Team: Candise Aleene DEL, MD as PCP - General (Family Medicine) Patel, Donika K, DO as Consulting Physician (Neurology) Tess Agent, MD as Consulting Physician (Sports Medicine) Livingston Rigg, MD as Consulting Physician (Dermatology) Jane Charleston, MD as Consulting Physician (Orthopedic Surgery) Cristy Bonner DASEN, MD as Consulting Physician (Orthopedic Surgery)  Indicate any recent Medical Services you may have received from other than [facility] providers in the past year (date may be approximate).     Assessment:   This is a routine wellness examination for Craig.  Hearing/Vision screen Hearing Screening - Comments:: Some trouble hearing  Does not use hearing  aids Vision Screening - Comments:: Up to date [facility] Has had cataracts surgery   Goals Addressed               This Visit's Progress     <enter goal here> (pt-stated)   On track     Keep my exercising going.       Patient Stated   On track     Maintain current health by staying active.       Patient Stated   On track     Maintain exercise       Patient Stated        Stay healthy        Depression Screen    09/18/2023    3:56 PM 08/02/2023    3:12 PM 02/01/2023    1:24 PM 08/15/2022    9:32 AM 01/16/2022    8:07 AM 07/12/2021    9:25 AM 01/13/2021    9:01 AM  PHQ 2/9 Scores  PHQ - 2 Score 0 0 0 0 0 0 0  PHQ- 9 Score 0  0        Fall Risk    09/18/2023    3:51 PM 08/02/2023    3:12 PM 02/01/2023    1:24 PM 08/15/2022    9:35 AM 07/12/2021    9:27 AM  Fall Risk   Falls in the past year? 1 1 1 1  0  Number falls in past yr: 0 0 0 1 0  Injury with Fall? 0 0 0 0 0  Risk for fall due to : Impaired balance/gait No Fall Risks;Impaired balance/gait History of fall(s) Impaired vision;Impaired balance/gait Impaired vision;Impaired balance/gait;Impaired mobility  Risk for fall due to: Comment     after tendon rupture  Follow up Falls evaluation completed;Education provided;Falls prevention discussed Falls evaluation completed Falls evaluation completed Falls prevention discussed Falls prevention discussed      Data saved with a previous flowsheet row  definition    MEDICARE RISK AT HOME: Medicare Risk at Home Any stairs in or around the home?: Yes If so, are there any without handrails?: No Home free of loose throw rugs in walkways, pet beds, electrical cords, etc?: Yes Adequate lighting in your home to reduce risk of falls?: Yes Life alert?: No Use of a cane, walker or w/c?: Yes Grab bars in the bathroom?: Yes Shower chair or bench in shower?: Yes Elevated toilet seat or a handicapped toilet?: Yes  TIMED UP AND GO:  Was the test performed?  No    Cognitive  Function:    05/28/2017    8:30 AM  MMSE - Mini Mental State Exam  Orientation to time 5  Orientation to Place 5  Registration 3  Attention/ Calculation 5  Recall 2  Language- name 2 objects 2  Language- repeat 1  Language- follow 3 step command 3  Language- read & follow direction 1  Write a sentence 1  Copy design 1  Total score 29        09/18/2023    3:53 PM 08/15/2022    9:36 AM 06/08/2020   11:29 AM  6CIT Screen  What Year? 0 points 0 points 0 points  What month? 0 points 0 points 0 points  What time? 0 points 0 points 0 points  Count back from 20 0 points 0 points 0 points  Months in reverse 2 points 0 points 2 points  Repeat phrase 2 points 0 points 2 points  Total Score 4 points 0 points 4 points    Immunizations Immunization History  Administered Date(s) Administered   Fluad Quad(high Dose 65+) 09/26/2018, 10/07/2019, 10/18/2020, 09/15/2021   Fluad Trivalent(High Dose 65+) 10/17/2022   INFLUENZA, HIGH DOSE SEASONAL PF 09/16/2015, 10/04/2016, 10/21/2017   Influenza,inj,Quad PF,6+ Mos 10/12/2013, 10/06/2014   PFIZER(Purple Top)SARS-COV-2 Vaccination 01/23/2019, 02/13/2019, 11/16/2019, 05/06/2020   Pfizer Covid-19 Vaccine Bivalent Booster 62yrs & up 10/28/2020   Pfizer(Comirnaty)Fall Seasonal Vaccine 12 years and older 11/02/2021   Pneumococcal Conjugate-13 05/20/2014   Pneumococcal Polysaccharide-23 09/17/2006   Respiratory Syncytial Virus Vaccine,Recomb Aduvanted(Arexvy) 07/30/2022   Td 08/09/2002   Tdap 02/09/2008, 05/14/2013   Zoster Recombinant(Shingrix) 07/30/2022   Zoster, Live 08/09/2010    TDAP status: Due, Education has been provided regarding the importance of this vaccine. Advised may receive this vaccine at local pharmacy or Health Dept. Aware to provide a copy of the vaccination record if obtained from local pharmacy or Health Dept. Verbalized acceptance and understanding.  Flu Vaccine status: Due, Education has been provided regarding the  importance of this vaccine. Advised may receive this vaccine at local pharmacy or Health Dept. Aware to provide a copy of the vaccination record if obtained from local pharmacy or Health Dept. Verbalized acceptance and understanding.  Pneumococcal vaccine status: Up to date  Covid-19 vaccine status: Information provided on how to obtain vaccines.   Qualifies for Shingles Vaccine? No   Zostavax completed Yes   Shingrix Completed?: Yes  Screening Tests Health Maintenance  Topic Date Due   Zoster Vaccines- Shingrix (2 of 2) 09/24/2022   DTaP/Tdap/Td (4 - Td or Tdap) 05/15/2023   Influenza Vaccine  08/09/2023   COVID-19 Vaccine (7 - 2025-26 season) 09/09/2023   Medicare Annual Wellness (AWV)  09/17/2024   Pneumococcal Vaccine: 50+ Years  Completed   HPV VACCINES  Aged Out   Meningococcal B Vaccine  Aged Out    Health Maintenance  Health Maintenance Due  Topic Date Due   Zoster  Vaccines- Shingrix (2 of 2) 09/24/2022   DTaP/Tdap/Td (4 - Td or Tdap) 05/15/2023   Influenza Vaccine  08/09/2023   COVID-19 Vaccine (7 - 2025-26 season) 09/09/2023    Colorectal cancer screening: No longer required.   Lung Cancer Screening: (Low Dose CT Chest recommended if Age 70-80 years, 20 pack-year currently smoking OR have quit w/in 15years.) does not qualify.   Lung Cancer Screening Referral:   Additional Screening:  Hepatitis C Screening: does  not qualify  Vision Screening: Recommended annual ophthalmology exams for early detection of glaucoma and other disorders of the eye. Is the patient up to date with their annual eye exam?  Yes  Who is the provider or what is the name of the office in which the patient attends annual eye exams? [facility]  If pt is not established with a provider, would they like to be referred to a provider to establish care? No .   Dental Screening: Recommended annual dental exams for proper oral hygiene   Community Resource Referral / Chronic Care  Management: CRR required this visit?  No   CCM required this visit?  No     Plan:     I have personally reviewed and noted the following in the patient's chart:   Medical and social history Use of alcohol, tobacco or illicit drugs  Current medications and supplements including opioid prescriptions. Patient is not currently taking opioid prescriptions. Functional ability and status Nutritional status Physical activity Advanced directives List of other physicians Hospitalizations, surgeries, and ER visits in previous 12 months Vitals Screenings to include cognitive, depression, and falls Referrals and appointments  In addition, I have reviewed and discussed with patient certain preventive protocols, quality metrics, and best practice recommendations. A written personalized care plan for preventive services as well as general preventive health recommendations were provided to patient.     Mliss Graff, LPN   [id number]   After Visit Summary: (MyChart) Due to this being a telephonic visit, the after visit summary with patients personalized plan was offered to patient via MyChart   Nurse Notes:

## 2023-10-03 ENCOUNTER — Telehealth: Payer: Self-pay

## 2023-10-03 DIAGNOSIS — Z23 Encounter for immunization: Secondary | ICD-10-CM

## 2023-10-03 MED ORDER — TETANUS-DIPHTH-ACELL PERTUSSIS 5-2.5-18.5 LF-MCG/0.5 IM SUSP: 0.5000 mL | Freq: Once | INTRAMUSCULAR | 0 refills | Status: AC

## 2023-10-03 NOTE — Telephone Encounter (Signed)
 Communication  Reason for CRM: Pt wife wants to know if Dr. Roseline recommends that e gets a covid vaccine, If so, she would like the prescription sent to [facility] on [address]. Pt also needs a tetanus shot. Does he need a prescription for that too? Pls notify pt when it has been sent.    Pt is due for updated tdap, last one given 05/14/13.

## 2023-10-03 NOTE — Telephone Encounter (Signed)
 Spoke with pt's wife to advise of new covid vaccine guidelines, she voiced understanding. New rx for tdap to be completed at the pharmacy sent. She was made aware and will inform pt

## 2023-10-03 NOTE — Telephone Encounter (Signed)
 Prescription for COVID-vaccine is not needed since he fits the CDC criteria for high risk. Go ahead and send Tdap prescription. Thanks

## 2023-10-29 DIAGNOSIS — K5900 Constipation, unspecified: Secondary | ICD-10-CM | POA: Diagnosis not present

## 2023-10-29 DIAGNOSIS — Z809 Family history of malignant neoplasm, unspecified: Secondary | ICD-10-CM | POA: Diagnosis not present

## 2023-10-29 DIAGNOSIS — R030 Elevated blood-pressure reading, without diagnosis of hypertension: Secondary | ICD-10-CM | POA: Diagnosis not present

## 2023-10-29 DIAGNOSIS — I499 Cardiac arrhythmia, unspecified: Secondary | ICD-10-CM | POA: Diagnosis not present

## 2023-10-29 DIAGNOSIS — Z9181 History of falling: Secondary | ICD-10-CM | POA: Diagnosis not present

## 2023-10-29 DIAGNOSIS — Z8249 Family history of ischemic heart disease and other diseases of the circulatory system: Secondary | ICD-10-CM | POA: Diagnosis not present

## 2023-10-29 DIAGNOSIS — M1990 Unspecified osteoarthritis, unspecified site: Secondary | ICD-10-CM | POA: Diagnosis not present

## 2024-02-06 ENCOUNTER — Encounter: Payer: Medicare HMO | Admitting: Family Medicine

## 2024-03-20 ENCOUNTER — Encounter: Admitting: Family Medicine

## 2024-09-23 ENCOUNTER — Encounter
